# Patient Record
Sex: Female | Born: 1955 | Race: Black or African American | Hispanic: No | Marital: Single | Smoking: Never smoker
Health system: Southern US, Community
[De-identification: ages and names within clinical notes are randomized; demographics above are authoritative.]

## PROBLEM LIST (undated history)

## (undated) DIAGNOSIS — E785 Hyperlipidemia, unspecified: Secondary | ICD-10-CM

## (undated) DIAGNOSIS — D649 Anemia, unspecified: Secondary | ICD-10-CM

## (undated) DIAGNOSIS — F32A Depression, unspecified: Secondary | ICD-10-CM

## (undated) DIAGNOSIS — M1990 Unspecified osteoarthritis, unspecified site: Secondary | ICD-10-CM

## (undated) DIAGNOSIS — R7303 Prediabetes: Secondary | ICD-10-CM

## (undated) DIAGNOSIS — F419 Anxiety disorder, unspecified: Secondary | ICD-10-CM

## (undated) DIAGNOSIS — R7302 Impaired glucose tolerance (oral): Secondary | ICD-10-CM

## (undated) DIAGNOSIS — I10 Essential (primary) hypertension: Secondary | ICD-10-CM

## (undated) DIAGNOSIS — K219 Gastro-esophageal reflux disease without esophagitis: Secondary | ICD-10-CM

## (undated) DIAGNOSIS — Z87448 Personal history of other diseases of urinary system: Secondary | ICD-10-CM

## (undated) DIAGNOSIS — R42 Dizziness and giddiness: Secondary | ICD-10-CM

## (undated) DIAGNOSIS — Z9889 Other specified postprocedural states: Secondary | ICD-10-CM

## (undated) DIAGNOSIS — G5131 Clonic hemifacial spasm, right: Secondary | ICD-10-CM

## (undated) DIAGNOSIS — Z86010 Personal history of colonic polyps: Secondary | ICD-10-CM

## (undated) HISTORY — DX: Dizziness and giddiness: R42

## (undated) HISTORY — DX: Personal history of other diseases of urinary system: Z87.448

## (undated) HISTORY — DX: Hyperlipidemia, unspecified: E78.5

## (undated) HISTORY — DX: Unspecified osteoarthritis, unspecified site: M19.90

## (undated) HISTORY — DX: Essential (primary) hypertension: I10

## (undated) HISTORY — PX: NO PAST SURGERIES: SHX2092

## (undated) HISTORY — DX: Other specified postprocedural states: Z98.890

## (undated) HISTORY — PX: COLONOSCOPY: SHX5424

## (undated) HISTORY — DX: Gastro-esophageal reflux disease without esophagitis: K21.9

## (undated) HISTORY — DX: Personal history of colonic polyps: Z86.010

## (undated) HISTORY — DX: Impaired glucose tolerance (oral): R73.02

---

## 1999-01-12 ENCOUNTER — Other Ambulatory Visit: Admission: RE | Admit: 1999-01-12 | Discharge: 1999-01-12 | Payer: Self-pay | Admitting: *Deleted

## 2000-01-01 ENCOUNTER — Other Ambulatory Visit: Admission: RE | Admit: 2000-01-01 | Discharge: 2000-01-01 | Payer: Self-pay | Admitting: *Deleted

## 2001-02-03 ENCOUNTER — Other Ambulatory Visit: Admission: RE | Admit: 2001-02-03 | Discharge: 2001-02-03 | Payer: Self-pay | Admitting: *Deleted

## 2002-06-07 ENCOUNTER — Ambulatory Visit (HOSPITAL_COMMUNITY): Admission: RE | Admit: 2002-06-07 | Discharge: 2002-06-07 | Payer: Self-pay | Admitting: Internal Medicine

## 2003-06-09 ENCOUNTER — Ambulatory Visit (HOSPITAL_COMMUNITY): Admission: RE | Admit: 2003-06-09 | Discharge: 2003-06-09 | Payer: Self-pay | Admitting: Internal Medicine

## 2003-08-09 ENCOUNTER — Other Ambulatory Visit: Admission: RE | Admit: 2003-08-09 | Discharge: 2003-08-09 | Payer: Self-pay | Admitting: Obstetrics and Gynecology

## 2004-06-04 ENCOUNTER — Ambulatory Visit: Payer: Self-pay | Admitting: Internal Medicine

## 2004-06-11 ENCOUNTER — Ambulatory Visit (HOSPITAL_COMMUNITY): Admission: RE | Admit: 2004-06-11 | Discharge: 2004-06-11 | Payer: Self-pay | Admitting: Family Medicine

## 2004-07-24 ENCOUNTER — Ambulatory Visit: Payer: Self-pay | Admitting: Internal Medicine

## 2004-09-04 ENCOUNTER — Ambulatory Visit: Payer: Self-pay | Admitting: Internal Medicine

## 2004-09-17 ENCOUNTER — Ambulatory Visit: Payer: Self-pay | Admitting: Internal Medicine

## 2004-09-18 ENCOUNTER — Ambulatory Visit: Payer: Self-pay | Admitting: Internal Medicine

## 2004-10-23 ENCOUNTER — Ambulatory Visit: Payer: Self-pay | Admitting: Internal Medicine

## 2004-10-24 ENCOUNTER — Ambulatory Visit: Payer: Self-pay | Admitting: Internal Medicine

## 2004-12-10 ENCOUNTER — Ambulatory Visit: Payer: Self-pay | Admitting: Internal Medicine

## 2004-12-11 ENCOUNTER — Other Ambulatory Visit: Admission: RE | Admit: 2004-12-11 | Discharge: 2004-12-11 | Payer: Self-pay | Admitting: Obstetrics and Gynecology

## 2004-12-18 ENCOUNTER — Ambulatory Visit: Payer: Self-pay | Admitting: Internal Medicine

## 2005-01-02 ENCOUNTER — Ambulatory Visit: Payer: Self-pay | Admitting: Internal Medicine

## 2005-01-04 ENCOUNTER — Ambulatory Visit: Payer: Self-pay | Admitting: *Deleted

## 2005-03-04 ENCOUNTER — Ambulatory Visit: Payer: Self-pay | Admitting: Internal Medicine

## 2005-09-26 ENCOUNTER — Encounter: Admission: RE | Admit: 2005-09-26 | Discharge: 2005-09-26 | Payer: Self-pay | Admitting: Obstetrics and Gynecology

## 2005-10-30 ENCOUNTER — Ambulatory Visit: Payer: Self-pay | Admitting: Internal Medicine

## 2006-06-05 ENCOUNTER — Ambulatory Visit: Payer: Self-pay | Admitting: Internal Medicine

## 2006-08-13 ENCOUNTER — Ambulatory Visit: Payer: Self-pay | Admitting: *Deleted

## 2006-09-29 ENCOUNTER — Ambulatory Visit (HOSPITAL_COMMUNITY): Admission: RE | Admit: 2006-09-29 | Discharge: 2006-09-29 | Payer: Self-pay | Admitting: Family Medicine

## 2006-10-30 ENCOUNTER — Ambulatory Visit: Payer: Self-pay | Admitting: Internal Medicine

## 2006-11-06 ENCOUNTER — Ambulatory Visit: Payer: Self-pay | Admitting: Internal Medicine

## 2007-01-22 DIAGNOSIS — I10 Essential (primary) hypertension: Secondary | ICD-10-CM

## 2007-01-22 HISTORY — DX: Essential (primary) hypertension: I10

## 2007-04-15 ENCOUNTER — Encounter (INDEPENDENT_AMBULATORY_CARE_PROVIDER_SITE_OTHER): Payer: Self-pay | Admitting: *Deleted

## 2007-04-22 ENCOUNTER — Ambulatory Visit: Payer: Self-pay | Admitting: Internal Medicine

## 2007-04-24 ENCOUNTER — Encounter (INDEPENDENT_AMBULATORY_CARE_PROVIDER_SITE_OTHER): Payer: Self-pay | Admitting: Internal Medicine

## 2007-04-24 LAB — CONVERTED CEMR LAB
Cholesterol: 204 mg/dL — ABNORMAL HIGH (ref 0–200)
HDL: 50 mg/dL (ref >39)
Total CHOL/HDL Ratio: 4.1
VLDL: 21 mg/dL (ref 0–40)

## 2007-06-10 ENCOUNTER — Ambulatory Visit: Payer: Self-pay | Admitting: Internal Medicine

## 2007-06-11 ENCOUNTER — Ambulatory Visit: Payer: Self-pay | Admitting: Internal Medicine

## 2007-06-11 LAB — CONVERTED CEMR LAB
LDL Cholesterol: 92 mg/dL (ref 0–99)
VLDL: 19 mg/dL (ref 0–40)

## 2007-10-28 ENCOUNTER — Ambulatory Visit (HOSPITAL_COMMUNITY): Admission: RE | Admit: 2007-10-28 | Discharge: 2007-10-28 | Payer: Self-pay | Admitting: Occupational Therapy

## 2007-12-14 ENCOUNTER — Ambulatory Visit: Payer: Self-pay | Admitting: Internal Medicine

## 2008-08-29 DIAGNOSIS — Z9889 Other specified postprocedural states: Secondary | ICD-10-CM

## 2008-08-29 HISTORY — DX: Other specified postprocedural states: Z98.890

## 2008-11-22 ENCOUNTER — Ambulatory Visit (HOSPITAL_COMMUNITY): Admission: RE | Admit: 2008-11-22 | Discharge: 2008-11-22 | Payer: Self-pay | Admitting: Obstetrics and Gynecology

## 2009-09-15 ENCOUNTER — Ambulatory Visit: Payer: Self-pay | Admitting: Internal Medicine

## 2009-09-15 LAB — CONVERTED CEMR LAB
Albumin: 4.2 g/dL (ref 3.5–5.2)
Alkaline Phosphatase: 78 units/L (ref 39–117)
Basophils Absolute: 0 10*3/uL (ref 0.0–0.1)
Chloride: 103 meq/L (ref 96–112)
Cholesterol: 153 mg/dL (ref 0–200)
Eosinophils Absolute: 0.1 10*3/uL (ref 0.0–0.7)
HDL: 56.3 mg/dL (ref >39.00)
Hemoglobin: 13.5 g/dL (ref 12.0–15.0)
LDL Cholesterol: 89 mg/dL (ref 0–99)
Leukocytes, UA: NEGATIVE
Lymphocytes Relative: 30.4 % (ref 12.0–46.0)
MCHC: 33.2 g/dL (ref 30.0–36.0)
Neutro Abs: 3.1 10*3/uL (ref 1.4–7.7)
Platelets: 278 10*3/uL (ref 150.0–400.0)
Potassium: 3.5 meq/L (ref 3.5–5.1)
RDW: 12.8 % (ref 11.5–14.6)
Sodium: 142 meq/L (ref 135–145)
Specific Gravity, Urine: 1.025 (ref 1.000–1.030)
TSH: 0.41 microintl units/mL (ref 0.35–5.50)
Triglycerides: 39 mg/dL (ref 0.0–149.0)
Urine Glucose: NEGATIVE mg/dL
Urobilinogen, UA: 0.2 (ref 0.0–1.0)
VLDL: 7.8 mg/dL (ref 0.0–40.0)

## 2009-09-22 ENCOUNTER — Ambulatory Visit: Payer: Self-pay | Admitting: Internal Medicine

## 2009-09-22 DIAGNOSIS — R42 Dizziness and giddiness: Secondary | ICD-10-CM

## 2009-09-22 DIAGNOSIS — Z87448 Personal history of other diseases of urinary system: Secondary | ICD-10-CM

## 2009-09-22 DIAGNOSIS — E739 Lactose intolerance, unspecified: Secondary | ICD-10-CM

## 2009-09-22 DIAGNOSIS — Z860100 Personal history of colon polyps, unspecified: Secondary | ICD-10-CM | POA: Insufficient documentation

## 2009-09-22 DIAGNOSIS — E785 Hyperlipidemia, unspecified: Secondary | ICD-10-CM

## 2009-09-22 DIAGNOSIS — K219 Gastro-esophageal reflux disease without esophagitis: Secondary | ICD-10-CM | POA: Insufficient documentation

## 2009-09-22 HISTORY — DX: Hyperlipidemia, unspecified: E78.5

## 2009-09-22 HISTORY — DX: Personal history of colonic polyps: Z86.010

## 2009-09-22 HISTORY — DX: Personal history of other diseases of urinary system: Z87.448

## 2009-09-22 HISTORY — DX: Gastro-esophageal reflux disease without esophagitis: K21.9

## 2009-09-22 HISTORY — DX: Personal history of colon polyps, unspecified: Z86.0100

## 2009-10-11 ENCOUNTER — Encounter: Payer: Self-pay | Admitting: Internal Medicine

## 2009-10-11 ENCOUNTER — Ambulatory Visit: Payer: Self-pay | Admitting: Cardiology

## 2009-10-11 ENCOUNTER — Ambulatory Visit (HOSPITAL_COMMUNITY): Admission: RE | Admit: 2009-10-11 | Discharge: 2009-10-11 | Payer: Self-pay | Admitting: Internal Medicine

## 2009-10-11 ENCOUNTER — Ambulatory Visit: Payer: Self-pay

## 2009-10-26 ENCOUNTER — Telehealth: Payer: Self-pay | Admitting: Internal Medicine

## 2009-12-27 ENCOUNTER — Ambulatory Visit (HOSPITAL_COMMUNITY): Admission: RE | Admit: 2009-12-27 | Discharge: 2009-12-27 | Payer: Self-pay | Admitting: Obstetrics and Gynecology

## 2010-08-30 NOTE — Progress Notes (Signed)
Summary: increased BS  Phone Note Call from Patient Call back at [phone]   Summary of Call: Patient left message on triage c/o increased BS levels. Please call her at [phone]. Initial call taken by: Lucious Groves,  October 26, 2009 2:23 PM  Follow-up for Phone Call        pt called stating that her glucose last lab was "high" and she wanted to know why MD did not RX any meds for this. I advised pt this was slightly out of range but not so much to need medications. pt understood. Follow-up by: Margaret Pyle, CMA,  October 26, 2009 2:44 PM

## 2010-08-30 NOTE — Miscellaneous (Signed)
Summary: Orders Update  Clinical Lists Changes  Orders: Added new Test order of Carotid Duplex (Carotid Duplex) - Signed 

## 2010-08-30 NOTE — Assessment & Plan Note (Signed)
Summary: CPX-LIGHT-HEADED,DIZZINESS,DRY MOUTH-LB   Vital Signs:  Patient profile:   55 year old female Height:      64 inches Weight:      168.25 pounds BMI:     28.98 O2 Sat:      96 % on Room air Temp:     97.6 degrees F oral Pulse rate:   114 / minute BP sitting:   100 / 60  (left arm) Cuff size:   regular  Vitals Entered ByZella Ball Ewing (September 22, 2009 8:53 AM)  O2 Flow:  Room air  Preventive Care Screening  Colonoscopy:    Date:  08/29/2008    Next Due:  08/2013    Results:  abnormal   Mammogram:    Date:  07/29/2009    Results:  normal   Pap Smear:    Date:  07/29/2009    Results:  normal   Last Tetanus Booster:    Date:  07/30/2003    Results:  Tdap      had flu shot in fall 2010  CC: Adult physical/RE   CC:  Adult physical/RE.  History of Present Illness: more stress lately with work and mother illness but denies depressive symtpoms or suicidal ideation or panic;  no overt bleeding or bruising;  no GU symtpoms such as blood, dysuria, freq or urgency.  trying to follow low chol diet;  overall good med tolerance and compliacne;  has some nonspecific dizziness and dry mouth but no polydipsia or polyuria or ENT symptoms such as fever, congestion or ear fullness or vertigo  Preventive Screening-Counseling & Management  Alcohol-Tobacco     Smoking Status: quit      Drug Use:  no.    Problems Prior to Update: 1)  Glucose Intolerance  (ICD-271.3) 2)  Gerd  (ICD-530.81) 3)  Preventive Health Care  (ICD-V70.0) 4)  Colonic Polyps, Hx of  (ICD-V12.72) 5)  Dizziness  (ICD-780.4) 6)  Hyperlipidemia  (ICD-272.4) 7)  Hematuria, Microscopic, Hx of  (ICD-V13.09) 8)  Hypertension  (ICD-401.9)  Medications Prior to Update: 1)  Lisinopril-Hydrochlorothiazide 10-12.5 Mg  Tabs (Lisinopril-Hydrochlorothiazide) .... One By Mouth Qd 2)  K-Lor 20 Meq  Pack (Potassium Chloride) .... One By Mouth Once Daily  Current Medications (verified): 1)  Benazepril Hcl 40 Mg  Tabs (Benazepril Hcl) .Marland Kitchen.. 1po Once Daily 2)  Pravachol 20 Mg Tabs (Pravastatin Sodium) .Marland Kitchen.. 1 By Mouth Once Daily  Allergies (verified): 1)  ! 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 mother with DM, heart disease, , HTN, elev chol sister with DM, thyroid disease p-grandmother - cancer  Social History: Reviewed history and no changes required. 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         all otherwise negative per pt -  no OSA symptoms  Physical Exam  General:  alert and overweight-appearing.   Head:  normocephalic and atraumatic.   Eyes:  vision grossly intact, pupils equal, and pupils round.   Ears:  R ear normal and L ear normal.   Nose:  no external deformity and no  nasal discharge.   Mouth:  no gingival abnormalities and pharynx pink and moist.   Neck:  supple and no masses.   Lungs:  normal respiratory effort and normal breath sounds.   Heart:  normal rate and regular rhythm.   Abdomen:  soft, non-tender, and normal bowel sounds.   Msk:  no joint tenderness and no joint swelling.   Extremities:  no edema, no erythema  Neurologic:  cranial nerves II-XII intact, strength normal in all extremities, sensation intact to light touch, and DTRs symmetrical and normal.   Psych:  moderately anxious.     Impression & Recommendations:  Problem # 1:  Preventive Health Care (ICD-V70.0) Overall doing well, age appropriate education and counseling updated and referral for appropriate preventive services done unless declined, immunizations up to date or declined, diet counseling done if overweight, urged to quit smoking if smokes , most recent labs reviewed and current ordered if appropriate, ecg reviewed or declined (interpretation per ECG scanned in the EMR if done); information regarding Medicare Prevention requirements given if appropriate  Orders: EKG w/ Interpretation (93000)  Problem # 2:  HEMATURIA, MICROSCOPIC, HX OF (ICD-V13.09) refer urology   Problem # 3:  DIZZINESS (ICD-780.4)  unclear etiology - to check echo, and dopplers given age and risk factors, ? related to anxiety, exam o/w benign, consider MRI or neuro exam  Orders: Echo Referral (Echo) Misc. Referral (Misc. Ref)  Problem # 4:  HYPERLIPIDEMIA (ICD-272.4)  Her updated medication list for this problem includes:    Pravachol 20 Mg Tabs (Pravastatin sodium) .Marland Kitchen... 1 by mouth once daily  Labs Reviewed: SGOT: 20 (09/15/2009)   SGPT: 24 (09/15/2009)   HDL:56.30 (09/15/2009), 49 (06/11/2007)  LDL:89 (09/15/2009), 92 (06/11/2007)  Chol:153 (09/15/2009), 160 (06/11/2007)  Trig:39.0 (09/15/2009), 93 (06/11/2007) stable overall by hx and exam, ok to continue meds/tx as is   Complete Medication  List: 1)  Benazepril Hcl 40 Mg Tabs (Benazepril hcl) .Marland Kitchen.. 1po once daily 2)  Pravachol 20 Mg Tabs (Pravastatin sodium) .Marland Kitchen.. 1 by mouth once daily  Patient Instructions: 1)  You will be contacted about the referral(s) to: echocardiogram, and the carotid artery test 2)  stop the lisinopril HCT 3)  stop the Potassium pill 4)  start the benazepril 40 mg per day 5)  continue the pravastatin as is 6)  You will be contacted about the referral(s) to: Echocardiogram, and carotid artery test 7)  Please schedule a follow-up appointment in 6 months with: 8)  BMP prior to visit, ICD-9: 790.2 9)  Lipid Panel prior to visit, ICD-9: 10)  HbgA1C prior to visit, ICD-9: 11)  Check your Blood Pressure regularly. If it is above 140/90: you should make an appointment. If it is LOW (regularly less than 110 on the top number and you still have the dizziness, you can take HALF of the benazepril) 12)  Take an Aspirin every day - 81 mg -  1 per day - COATED pill only 13)  Plesase call or return for any persistent or worsening dizziness Prescriptions: PRAVACHOL 20 MG TABS (PRAVASTATIN SODIUM) 1 by mouth once daily  #90 x 3   Entered and Authorized by:   Corwin Levins MD   Signed by:   Corwin Levins MD on 09/22/2009   Method used:   Print then Give to Patient   RxID:   [id number] BENAZEPRIL HCL 40 MG TABS (BENAZEPRIL HCL) 1po once daily  #90 x 3   Entered and Authorized by:   Corwin Levins MD   Signed by:   Corwin Levins MD on 09/22/2009   Method used:   Print then Give to Patient   RxID:   [id number]    Immunization History:  Influenza Immunization History:    Influenza:  historical (04/28/2009)

## 2010-10-27 ENCOUNTER — Other Ambulatory Visit: Payer: Self-pay | Admitting: Internal Medicine

## 2010-11-22 ENCOUNTER — Other Ambulatory Visit: Payer: Self-pay | Admitting: Internal Medicine

## 2010-11-30 ENCOUNTER — Other Ambulatory Visit (HOSPITAL_COMMUNITY): Payer: Self-pay | Admitting: Obstetrics and Gynecology

## 2010-11-30 DIAGNOSIS — Z1231 Encounter for screening mammogram for malignant neoplasm of breast: Secondary | ICD-10-CM

## 2010-12-27 ENCOUNTER — Other Ambulatory Visit: Payer: Self-pay | Admitting: Internal Medicine

## 2011-01-06 ENCOUNTER — Encounter: Payer: Self-pay | Admitting: Internal Medicine

## 2011-01-06 DIAGNOSIS — R7303 Prediabetes: Secondary | ICD-10-CM | POA: Insufficient documentation

## 2011-01-06 DIAGNOSIS — R7302 Impaired glucose tolerance (oral): Secondary | ICD-10-CM

## 2011-01-06 HISTORY — DX: Impaired glucose tolerance (oral): R73.02

## 2011-01-07 ENCOUNTER — Encounter: Payer: Self-pay | Admitting: Internal Medicine

## 2011-01-08 ENCOUNTER — Encounter: Payer: Self-pay | Admitting: Internal Medicine

## 2011-01-08 ENCOUNTER — Ambulatory Visit (HOSPITAL_COMMUNITY)
Admission: RE | Admit: 2011-01-08 | Discharge: 2011-01-08 | Disposition: A | Discharge status: Home / Self Care | Payer: BC Managed Care – PPO | Source: Ambulatory Visit | Attending: Obstetrics and Gynecology | Admitting: Obstetrics and Gynecology

## 2011-01-08 ENCOUNTER — Other Ambulatory Visit (INDEPENDENT_AMBULATORY_CARE_PROVIDER_SITE_OTHER): Payer: BC Managed Care – PPO

## 2011-01-08 ENCOUNTER — Ambulatory Visit (INDEPENDENT_AMBULATORY_CARE_PROVIDER_SITE_OTHER): Payer: BC Managed Care – PPO | Admitting: Internal Medicine

## 2011-01-08 VITALS — BP 120/82 | HR 83 | Temp 99.0°F | Ht 64.0 in | Wt 169.1 lb

## 2011-01-08 DIAGNOSIS — Z1231 Encounter for screening mammogram for malignant neoplasm of breast: Secondary | ICD-10-CM | POA: Insufficient documentation

## 2011-01-08 DIAGNOSIS — F341 Dysthymic disorder: Secondary | ICD-10-CM

## 2011-01-08 DIAGNOSIS — Z0000 Encounter for general adult medical examination without abnormal findings: Secondary | ICD-10-CM

## 2011-01-08 DIAGNOSIS — F419 Anxiety disorder, unspecified: Secondary | ICD-10-CM | POA: Insufficient documentation

## 2011-01-08 DIAGNOSIS — R7302 Impaired glucose tolerance (oral): Secondary | ICD-10-CM

## 2011-01-08 DIAGNOSIS — F329 Major depressive disorder, single episode, unspecified: Secondary | ICD-10-CM

## 2011-01-08 DIAGNOSIS — R7309 Other abnormal glucose: Secondary | ICD-10-CM

## 2011-01-08 LAB — URINALYSIS, ROUTINE W REFLEX MICROSCOPIC
Nitrite: NEGATIVE
Total Protein, Urine: NEGATIVE
pH: 6.5 (ref 5.0–8.0)

## 2011-01-08 LAB — LIPID PANEL
Cholesterol: 149 mg/dL (ref 0–200)
HDL: 49.5 mg/dL (ref >39.00)
LDL Cholesterol: 85 mg/dL (ref 0–99)
Triglycerides: 73 mg/dL (ref 0.0–149.0)
VLDL: 14.6 mg/dL (ref 0.0–40.0)

## 2011-01-08 LAB — CBC WITH DIFFERENTIAL/PLATELET
Basophils Relative: 0.3 % (ref 0.0–3.0)
Eosinophils Absolute: 0.2 10*3/uL (ref 0.0–0.7)
MCHC: 34.2 g/dL (ref 30.0–36.0)
MCV: 92 fl (ref 78.0–100.0)
Monocytes Absolute: 0.5 10*3/uL (ref 0.1–1.0)
Neutrophils Relative %: 56.6 % (ref 43.0–77.0)
Platelets: 269 10*3/uL (ref 150.0–400.0)

## 2011-01-08 LAB — BASIC METABOLIC PANEL
BUN: 16 mg/dL (ref 6–23)
Chloride: 102 mEq/L (ref 96–112)
Potassium: 3.7 mEq/L (ref 3.5–5.1)
Sodium: 138 mEq/L (ref 135–145)

## 2011-01-08 LAB — HEPATIC FUNCTION PANEL
Bilirubin, Direct: 0.1 mg/dL (ref 0.0–0.3)
Total Bilirubin: 0.5 mg/dL (ref 0.3–1.2)
Total Protein: 8.1 g/dL (ref 6.0–8.3)

## 2011-01-08 MED ORDER — ESCITALOPRAM OXALATE 10 MG PO TABS: 10.0000 mg | ORAL_TABLET | Freq: Every day | ORAL | Status: AC

## 2011-01-08 MED ORDER — PRAVASTATIN SODIUM 20 MG PO TABS: 20.0000 mg | ORAL_TABLET | Freq: Every day | ORAL | Status: DC

## 2011-01-08 MED ORDER — ESCITALOPRAM OXALATE 10 MG PO TABS: 10.0000 mg | ORAL_TABLET | Freq: Every day | ORAL | Status: DC

## 2011-01-08 MED ORDER — BENAZEPRIL HCL 40 MG PO TABS: 40.0000 mg | ORAL_TABLET | Freq: Every day | ORAL | Status: DC

## 2011-01-08 NOTE — Assessment & Plan Note (Signed)
asymtp - to check a1c

## 2011-01-08 NOTE — Progress Notes (Signed)
Subjective:    Patient ID: Julie Greene, female    DOB: May 02, 1956, 55 y.o.   MRN: [id number]  HPI  Here for wellness and f/u;  Overall doing ok;  Pt denies CP, worsening SOB, DOE, wheezing, orthopnea, PND, worsening LE edema, palpitations, dizziness or syncope.  Pt denies neurological change such as new Headache, facial or extremity weakness.  Pt denies polydipsia, polyuria, or low sugar symptoms. Pt states overall good compliance with treatment and medications, good tolerability, and trying to follow lower cholesterol diet.  Pt has had mild worsening depressive symptoms and anxiety due to mult stressors, but no suicidal ideation or panic. No fever, wt loss, night sweats, loss of appetite, or other constitutional symptoms.  Pt states good ability with ADL's, low fall risk, home safety reviewed and adequate, no significant changes in hearing or vision, and occasionally active with exercise. Past Medical History  Diagnosis Date  . Impaired glucose tolerance 01/06/2011  . COLONIC POLYPS, HX OF 09/22/2009  . HEMATURIA, MICROSCOPIC, HX OF 09/22/2009  . GERD 09/22/2009  . HYPERLIPIDEMIA 09/22/2009  . HYPERTENSION 01/22/2007  . Fibroid, uterine   . Anxiety and depression 01/08/2011   Past Surgical History  Procedure Date  . No past surgeries     reports that she has been passively smoking.  She does not have any smokeless tobacco history on file. She reports that she drinks alcohol. She reports that she does not use illicit drugs. family history includes Diabetes in her mother; Hyperlipidemia in her mother; and Hypertension in her mother. Allergies  Allergen Reactions  . Sulfonamide Derivatives     REACTION: unknown rxn   Current Outpatient Prescriptions on File Prior to Visit  Medication Sig Dispense Refill  . DISCONTD: benazepril (LOTENSIN) 40 MG tablet Take 40 mg by mouth daily.        Marland Kitchen DISCONTD: pravastatin (PRAVACHOL) 20 MG tablet TAKE ONE TABLET BY MOUTH EVERY DAY  30 tablet  0     Review of Systems Review of Systems  Constitutional: Negative for diaphoresis, activity change, appetite change and unexpected weight change.  HENT: Negative for hearing loss, ear pain, facial swelling, mouth sores and neck stiffness.   Eyes: Negative for pain, redness and visual disturbance.  Respiratory: Negative for shortness of breath and wheezing.   Cardiovascular: Negative for chest pain and palpitations.  Gastrointestinal: Negative for diarrhea, blood in stool, abdominal distention and rectal pain.  Genitourinary: Negative for hematuria, flank pain and decreased urine volume.  Musculoskeletal: Negative for myalgias and joint swelling.  Skin: Negative for color change and wound.  Neurological: Negative for syncope and numbness.  Hematological: Negative for adenopathy.  Psychiatric/Behavioral: Negative for hallucinations, self-injury, decreased concentration and agitation.      Objective:   Physical Exam BP 120/82  Pulse 83  Temp(Src) 99 F (37.2 C) (Oral)  Ht 5\' 4"  (1.626 m)  Wt 169 lb 2 oz (76.715 kg)  BMI 29.03 kg/m2  SpO2 99% Physical Exam  VS noted Constitutional: Pt is oriented to person, place, and time. Appears well-developed and well-nourished.  HENT:  Head: Normocephalic and atraumatic.  Right Ear: External ear normal.  Left Ear: External ear normal.  Nose: Nose normal.  Mouth/Throat: Oropharynx is clear and moist.  Eyes: Conjunctivae and EOM are normal. Pupils are equal, round, and reactive to light.  Neck: Normal range of motion. Neck supple. No JVD present. No tracheal deviation present.  Cardiovascular: Normal rate, regular rhythm, normal heart sounds and intact distal pulses.   Pulmonary/Chest:  Effort normal and breath sounds normal.  Abdominal: Soft. Bowel sounds are normal. There is no tenderness.  Musculoskeletal: Normal range of motion. Exhibits no edema.  Lymphadenopathy:  Has no cervical adenopathy.  Neurological: Pt is alert and oriented to  person, place, and time. Pt has normal reflexes. No cranial nerve deficit.  Skin: Skin is warm and dry. No rash noted.  Psychiatric:  Has  normal mood and affect. Behavior is normal.  Has depressed affect       Assessment & Plan:

## 2011-01-08 NOTE — Assessment & Plan Note (Signed)

## 2011-01-08 NOTE — Assessment & Plan Note (Signed)
Mild to mod, for lexapro asd, declines counseling at this time

## 2011-01-08 NOTE — Progress Notes (Signed)
Quick Note:  Voice message left on [employer] system - lab is negative, normal or otherwise stable, pt to continue same tx ______ 

## 2011-01-08 NOTE — Patient Instructions (Signed)
Take all new medications as prescribed Continue all other medications as before Please go to LAB in the Basement for the blood and/or urine tests to be done today Please call the phone number [phone] (the [employer] System) for results of testing in 2-3 days;  When calling, simply dial the number, and when prompted enter the MRN number above (the Medical Record Number) and the # key, then the message should start. Happy Birthday! Please call if you would like to be referred for counseling Please return in 1 year for your yearly visit, or sooner if needed, with Lab testing done 3-5 days before

## 2011-02-06 ENCOUNTER — Other Ambulatory Visit: Payer: Self-pay | Admitting: Internal Medicine

## 2011-03-18 ENCOUNTER — Other Ambulatory Visit: Payer: Self-pay | Admitting: Internal Medicine

## 2011-03-19 ENCOUNTER — Other Ambulatory Visit: Payer: Self-pay

## 2011-03-19 MED ORDER — FENOFIBRATE 160 MG PO TABS: 160.0000 mg | ORAL_TABLET | Freq: Every day | ORAL | Status: DC

## 2011-04-16 ENCOUNTER — Other Ambulatory Visit: Payer: Self-pay | Admitting: Internal Medicine

## 2011-08-21 ENCOUNTER — Telehealth: Payer: Self-pay

## 2011-08-21 NOTE — Telephone Encounter (Signed)
Patient's BP has been elevated. Saturday 152/102 and checked today was 164/114. Patient has had some numbness in left arm, thinks may be from tendonitis?? Call back number [phone]

## 2011-08-22 MED ORDER — AMLODIPINE BESYLATE 2.5 MG PO TABS: 2.5000 mg | ORAL_TABLET | Freq: Every day | ORAL | Status: DC

## 2011-08-22 NOTE — Telephone Encounter (Signed)
Start generic norvasc 2.5 mg daily in addition to other meds - erx done - pt needs OV next week with PCP to review BP and meds, titrate as needed - take OTC aleve 1 tab bid for 5 days for tendonitis, call for OV if worse or unimproved symptoms - thanks

## 2011-10-29 ENCOUNTER — Telehealth: Payer: Self-pay | Admitting: Internal Medicine

## 2011-10-29 NOTE — Telephone Encounter (Signed)
The pt called and asked how her upcoming ov would be billed to her insurance.  The phone line was disconnected on our end on accident.  The patient was asked to hold, but the red 'hang up' button was pressed on accident.  The patient called back promptly and while looking up her appt information, she hung up the line.

## 2011-10-31 ENCOUNTER — Encounter: Payer: Self-pay | Admitting: Internal Medicine

## 2011-10-31 ENCOUNTER — Ambulatory Visit (INDEPENDENT_AMBULATORY_CARE_PROVIDER_SITE_OTHER): Payer: PRIVATE HEALTH INSURANCE | Admitting: Internal Medicine

## 2011-10-31 VITALS — BP 130/80 | HR 82 | Temp 97.3°F | Ht 64.0 in | Wt 159.0 lb

## 2011-10-31 DIAGNOSIS — E785 Hyperlipidemia, unspecified: Secondary | ICD-10-CM

## 2011-10-31 DIAGNOSIS — I10 Essential (primary) hypertension: Secondary | ICD-10-CM

## 2011-10-31 DIAGNOSIS — F341 Dysthymic disorder: Secondary | ICD-10-CM

## 2011-10-31 DIAGNOSIS — F329 Major depressive disorder, single episode, unspecified: Secondary | ICD-10-CM

## 2011-10-31 DIAGNOSIS — R7309 Other abnormal glucose: Secondary | ICD-10-CM

## 2011-10-31 DIAGNOSIS — R7302 Impaired glucose tolerance (oral): Secondary | ICD-10-CM

## 2011-10-31 MED ORDER — PRAVASTATIN SODIUM 20 MG PO TABS: 20.0000 mg | ORAL_TABLET | Freq: Every day | ORAL | Status: DC

## 2011-10-31 MED ORDER — FENOFIBRATE 160 MG PO TABS: 160.0000 mg | ORAL_TABLET | Freq: Every day | ORAL | Status: DC

## 2011-10-31 MED ORDER — ASPIRIN 81 MG PO TBEC: 81.0000 mg | DELAYED_RELEASE_TABLET | Freq: Every day | ORAL | Status: AC

## 2011-10-31 MED ORDER — BENAZEPRIL HCL 40 MG PO TABS: 40.0000 mg | ORAL_TABLET | Freq: Every day | ORAL | Status: DC

## 2011-10-31 NOTE — Patient Instructions (Addendum)
Please take Aspirin 81 mg - 1 per day - COATED only Continue all other medications as before, except ok to stay off the amlodipine as you have been doing All of your prescriptions were refilled today Please keep your appointments with your specialists as you have planned - GYN Please return in 1 year for your yearly visit, or sooner if needed

## 2011-11-03 ENCOUNTER — Encounter: Payer: Self-pay | Admitting: Internal Medicine

## 2011-11-03 NOTE — Assessment & Plan Note (Signed)
stable overall by hx and exam, most recent data reviewed with pt, and pt to continue medical treatment as before  Lab Results  Component Value Date   LDLCALC 85 01/08/2011

## 2011-11-03 NOTE — Assessment & Plan Note (Signed)
stable overall by hx and exam, most recent data reviewed with pt, and pt to continue medical treatment as before  Lab Results  Component Value Date   HGBA1C 6.2 01/08/2011

## 2011-11-03 NOTE — Progress Notes (Signed)
Subjective:    Patient ID: Julie Greene, female    DOB: [birth year], 56 y.o.   MRN: [id number]  HPI  Here to f/u;  BP elev jan 2013, took amlod 2.5 mg for one month then stopped since it seemed better, and overall better since then per pt with mult BP checks since then with focus on lower stress, less pain, less salt and working on wt loss.  Pt denies chest pain, increased sob or doe, wheezing, orthopnea, PND, increased LE swelling, palpitations, dizziness or syncope.  Pt denies new neurological symptoms such as new headache, or facial or extremity weakness or numbness   Pt denies polydipsia, polyuria Pt states overall good compliance with meds, trying to follow lower cholesterol diet.   Pt denies fever, wt loss, night sweats, loss of appetite, or other constitutional symptoms  Denies worsening depressive symptoms, suicidal ideation, or panic, though has ongoing anxiety. Past Medical History  Diagnosis Date  . Impaired glucose tolerance 01/06/2011  . COLONIC POLYPS, HX OF 09/22/2009  . HEMATURIA, MICROSCOPIC, HX OF 09/22/2009  . GERD 09/22/2009  . HYPERLIPIDEMIA 09/22/2009  . HYPERTENSION 01/22/2007  . Fibroid, uterine   . Anxiety and depression 01/08/2011   Past Surgical History  Procedure Date  . No past surgeries     reports that she has been passively smoking.  She does not have any smokeless tobacco history on file. She reports that she drinks alcohol. She reports that she does not use illicit drugs. family history includes Diabetes in her mother; Hyperlipidemia in her mother; and Hypertension in her mother. Allergies  Allergen Reactions  . Sulfonamide Derivatives     REACTION: unknown rxn   Current Outpatient Prescriptions on File Prior to Visit  Medication Sig Dispense Refill  . benazepril (LOTENSIN) 40 MG tablet Take 1 tablet (40 mg total) by mouth daily.  90 tablet  3  . pravastatin (PRAVACHOL) 20 MG tablet Take 1 tablet (20 mg total) by mouth daily.  90 tablet  3  . fenofibrate  160 MG tablet Take 1 tablet (160 mg total) by mouth daily.  90 tablet  0   Review of Systems Review of Systems  Constitutional: Negative for diaphoresis and unexpected weight change.  HENT: Negative for drooling and tinnitus.   Eyes: Negative for photophobia and visual disturbance.  Respiratory: Negative for choking and stridor.   Gastrointestinal: Negative for vomiting and blood in stool.  Genitourinary: Negative for hematuria and decreased urine volume.  Musculoskeletal: Negative for gait problem.  Skin: Negative for color change and wound.  Neurological: Negative for tremors and numbness.  Psychiatric/Behavioral: Negative for decreased concentration. The patient is not hyperactive.       Objective:   Physical Exam BP 130/80  Pulse 82  Temp(Src) 97.3 F (36.3 C) (Oral)  Ht 5\' 4"  (1.626 m)  Wt 159 lb (72.122 kg)  BMI 27.29 kg/m2  SpO2 97% Physical Exam  VS noted Constitutional: Pt appears well-developed and well-nourished.  HENT: Head: Normocephalic.  Right Ear: External ear normal.  Left Ear: External ear normal.  Eyes: Conjunctivae and EOM are normal. Pupils are equal, round, and reactive to light.  Neck: Normal range of motion. Neck supple.  Cardiovascular: Normal rate and regular rhythm.   Pulmonary/Chest: Effort normal and breath sounds normal.  Abd:  Soft, NT, non-distended, + BS Neurological: Pt is alert. No cranial nerve deficit.  Skin: Skin is warm. No erythema.  Psychiatric: Pt behavior is normal. Thought content normal.     Assessment &  Plan:

## 2011-11-03 NOTE — Assessment & Plan Note (Signed)
Control unclear but at least today - stable overall by hx and exam, most recent data reviewed with pt, and pt to continue as is on current meds  BP Readings from Last 3 Encounters:  10/31/11 130/80  01/08/11 120/82  09/22/09 100/60

## 2011-11-03 NOTE — Assessment & Plan Note (Signed)
stable overall by hx and exam, and pt to continue medical treatment as before, declines need for more tx or counseling

## 2012-07-27 ENCOUNTER — Encounter: Payer: Self-pay | Admitting: Internal Medicine

## 2012-07-27 ENCOUNTER — Ambulatory Visit (INDEPENDENT_AMBULATORY_CARE_PROVIDER_SITE_OTHER): Payer: PRIVATE HEALTH INSURANCE | Admitting: Internal Medicine

## 2012-07-27 VITALS — BP 142/98 | HR 78 | Temp 97.4°F | Resp 10 | Wt 151.0 lb

## 2012-07-27 DIAGNOSIS — E785 Hyperlipidemia, unspecified: Secondary | ICD-10-CM

## 2012-07-27 DIAGNOSIS — I10 Essential (primary) hypertension: Secondary | ICD-10-CM

## 2012-07-27 MED ORDER — AMLODIPINE BESYLATE 2.5 MG PO TABS: 2.5000 mg | ORAL_TABLET | Freq: Every day | ORAL | Status: DC

## 2012-07-27 NOTE — Patient Instructions (Addendum)
Blood pressure - definitely running high. You were on amlodipine in the past and seemed to do well with that Plan - continue benazepril 40 mg daily  Add Amlodipine 2.5 mg daily  Check BP and if not controlled in the next 3-5 days increase the amlodipine to 5 mg (2 x 2.5 mg) daily and follow up with Dr. Jonny Ruiz  Cholesterol - last lipid panel in June '12 was good. Evidently this was on pravastatin alone Plan  Continue pravastatin once a day, after supper is slightly better.  Have a Happy New Year.

## 2012-07-29 NOTE — Progress Notes (Signed)
  Subjective:    Patient ID: Julie Greene, female    DOB: [birth year], 57 y.o.   MRN: [id number]  HPI [name] presents due to elevated BP readings at home and by nurse. She has been asymptomatic. This has been a chronic problem.  She has questions about lipid management. She had fenofibrate on her med list but she does not recall ever taking this medication.  PMH, FamHx and SocHx reviewed for any changes and relevance. Current Outpatient Prescriptions on File Prior to Visit  Medication Sig Dispense Refill  . aspirin 81 MG EC tablet Take 1 tablet (81 mg total) by mouth daily. Swallow whole.  30 tablet  12  . benazepril (LOTENSIN) 40 MG tablet Take 1 tablet (40 mg total) by mouth daily.  90 tablet  3  . pravastatin (PRAVACHOL) 20 MG tablet Take 1 tablet (20 mg total) by mouth daily.  90 tablet  3  . amLODipine (NORVASC) 2.5 MG tablet Take 1 tablet (2.5 mg total) by mouth daily.  90 tablet  3      Review of Systems System review is negative for any constitutional, cardiac, pulmonary, GI or neuro symptoms or complaints other than as described in the HPI.     Objective:   Physical Exam Filed Vitals:   07/27/12 1202  BP: 142/98  Pulse: 78  Temp: 97.4 F (36.3 C)  Resp: 10   Gen'l- WNWD AA woman in no distress Cor- RRR Pulm - normal respirations Neuro - a&O x 3       Assessment & Plan:

## 2012-07-29 NOTE — Assessment & Plan Note (Signed)
Blood pressure - definitely running high. You were on amlodipine in the past and seemed to do well with that Plan - continue benazepril 40 mg daily  Add Amlodipine 2.5 mg daily  Check BP and if not controlled in the next 3-5 days increase the amlodipine to 5 mg (2 x 2.5 mg) daily and follow up with Dr. Jonny Ruiz

## 2012-07-29 NOTE — Assessment & Plan Note (Signed)
Cholesterol - last lipid panel in June '12 was good. Evidently this was on pravastatin alone Plan  Continue pravastatin once a day, after supper is slightly better.

## 2012-10-12 ENCOUNTER — Telehealth: Payer: Self-pay | Admitting: Internal Medicine

## 2012-10-12 ENCOUNTER — Other Ambulatory Visit: Payer: Self-pay | Admitting: Obstetrics & Gynecology

## 2012-10-12 DIAGNOSIS — Z1231 Encounter for screening mammogram for malignant neoplasm of breast: Secondary | ICD-10-CM

## 2012-10-12 NOTE — Telephone Encounter (Signed)
Ok with me 

## 2012-10-12 NOTE — Telephone Encounter (Signed)
Pt wants to switch from Dr. Jonny Ruiz to Dr. Debby Bud.

## 2012-10-12 NOTE — Telephone Encounter (Signed)
Pt is aware and has made an appt with Dr. Debby Bud in April.

## 2012-10-22 ENCOUNTER — Ambulatory Visit (HOSPITAL_COMMUNITY): Payer: PRIVATE HEALTH INSURANCE

## 2012-10-27 ENCOUNTER — Other Ambulatory Visit (INDEPENDENT_AMBULATORY_CARE_PROVIDER_SITE_OTHER): Payer: No Typology Code available for payment source

## 2012-10-27 ENCOUNTER — Encounter: Payer: Self-pay | Admitting: Internal Medicine

## 2012-10-27 ENCOUNTER — Ambulatory Visit (INDEPENDENT_AMBULATORY_CARE_PROVIDER_SITE_OTHER): Payer: No Typology Code available for payment source | Admitting: Internal Medicine

## 2012-10-27 VITALS — BP 120/72 | HR 83 | Temp 98.7°F | Resp 8 | Ht 64.0 in | Wt 153.0 lb

## 2012-10-27 DIAGNOSIS — Z0000 Encounter for general adult medical examination without abnormal findings: Secondary | ICD-10-CM

## 2012-10-27 DIAGNOSIS — K219 Gastro-esophageal reflux disease without esophagitis: Secondary | ICD-10-CM

## 2012-10-27 DIAGNOSIS — R7309 Other abnormal glucose: Secondary | ICD-10-CM

## 2012-10-27 DIAGNOSIS — I10 Essential (primary) hypertension: Secondary | ICD-10-CM

## 2012-10-27 DIAGNOSIS — R7302 Impaired glucose tolerance (oral): Secondary | ICD-10-CM

## 2012-10-27 DIAGNOSIS — E785 Hyperlipidemia, unspecified: Secondary | ICD-10-CM

## 2012-10-27 LAB — LIPID PANEL
HDL: 43.6 mg/dL (ref >39.00)
LDL Cholesterol: 73 mg/dL (ref 0–99)
Total CHOL/HDL Ratio: 3
Triglycerides: 129 mg/dL (ref 0.0–149.0)
VLDL: 25.8 mg/dL (ref 0.0–40.0)

## 2012-10-27 LAB — CBC WITH DIFFERENTIAL/PLATELET
Basophils Relative: 0.3 % (ref 0.0–3.0)
Eosinophils Absolute: 0.1 10*3/uL (ref 0.0–0.7)
Eosinophils Relative: 1.3 % (ref 0.0–5.0)
Hemoglobin: 13.8 g/dL (ref 12.0–15.0)
Lymphocytes Relative: 30 % (ref 12.0–46.0)
Monocytes Relative: 5.3 % (ref 3.0–12.0)
Neutro Abs: 3.8 10*3/uL (ref 1.4–7.7)
Neutrophils Relative %: 63.1 % (ref 43.0–77.0)
RBC: 4.58 Mil/uL (ref 3.87–5.11)
WBC: 5.9 10*3/uL (ref 4.5–10.5)

## 2012-10-27 LAB — COMPREHENSIVE METABOLIC PANEL
ALT: 21 U/L (ref 0–35)
Alkaline Phosphatase: 93 U/L (ref 39–117)
CO2: 30 mEq/L (ref 19–32)
Creatinine, Ser: 0.9 mg/dL (ref 0.4–1.2)
GFR: 88.41 mL/min (ref >60.00)
Sodium: 141 mEq/L (ref 135–145)
Total Bilirubin: 0.4 mg/dL (ref 0.3–1.2)

## 2012-10-27 LAB — HEPATIC FUNCTION PANEL
ALT: 21 U/L (ref 0–35)
AST: 20 U/L (ref 0–37)
Albumin: 4.1 g/dL (ref 3.5–5.2)
Alkaline Phosphatase: 93 U/L (ref 39–117)

## 2012-10-27 NOTE — Progress Notes (Signed)
Subjective:    Patient ID: Julie Greene, female    DOB: [birth year], 57 y.o.   MRN: [id number]  HPI Julie Greene presents for an annual wellness exam. She has been doing OK except for pruritis of the back. She sees Dr. Tamela Oddi for Gyn - she had her annual in June '13. She has had a mammogram. She has had colonoscopy. She not been to dentist due to $$. She has had an eye exam in the last 2 years - all is well.   Past Medical History  Diagnosis Date  . Impaired glucose tolerance 01/06/2011  . COLONIC POLYPS, HX OF 09/22/2009  . HEMATURIA, MICROSCOPIC, HX OF 09/22/2009  . GERD 09/22/2009  . HYPERLIPIDEMIA 09/22/2009  . HYPERTENSION 01/22/2007  . Fibroid, uterine   . Hx of colonoscopy 08/29/2008   Past Surgical History  Procedure Laterality Date  . No past surgeries     Family History  Problem Relation Age of Onset  . Diabetes Mother   . Hyperlipidemia Mother   . Hypertension Mother   . Heart disease Mother   . Diabetes Sister   . Thyroid disease Sister   . Cancer Paternal Grandmother   . Hypertension Sister   . Hyperlipidemia Sister   . Hyperlipidemia Sister   . Hypertension Sister   . Diabetes Sister    History   Social History  . Marital Status: Single    Spouse Name: N/A    Number of Children: 0  . Years of Education: 14   Occupational History  . marketing clerk [employer]   Social History Main Topics  . Smoking status: Never Smoker   . Smokeless tobacco: Never Used     Comment: Rare social in the past only  . Alcohol Use: 1.0 oz/week    2 drink(s) per week     Comment: socially  . Drug Use: No  . Sexually Active: Yes -- Female partner(s)   Other Topics Concern  . 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 Looking for work (March '14). Single. No children. Dating - monogamous -9. Lives alone. No history or abuse.          Current Outpatient  Prescriptions on File Prior to Visit  Medication Sig Dispense Refill  . amLODipine (NORVASC) 2.5 MG tablet Take 1 tablet (2.5 mg total) by mouth daily.  90 tablet  3  . aspirin 81 MG EC tablet Take 1 tablet (81 mg total) by mouth daily. Swallow whole.  30 tablet  12  . benazepril (LOTENSIN) 40 MG tablet Take 1 tablet (40 mg total) by mouth daily.  90 tablet  3  . pravastatin (PRAVACHOL) 20 MG tablet Take 1 tablet (20 mg total) by mouth daily.  90 tablet  3   No current facility-administered medications on file prior to visit.       Review of Systems Constitutional:  Negative for fever, chills, activity change and unexpected weight change.  HEENT:  Negative for hearing loss, ear pain, congestion, neck stiffness and postnasal drip. Negative for sore throat or swallowing problems. Negative for dental complaints.   Eyes: Negative for vision loss or change in visual acuity.  Respiratory: Negative for chest tightness and wheezing. Negative for DOE.   Cardiovascular: Negative for chest pain or palpitations. No decreased exercise tolerance Gastrointestinal: No change in bowel habit. No bloating or gas. No reflux or indigestion Genitourinary: Negative  for urgency, frequency, flank pain and difficulty urinating.  Musculoskeletal: Negative for myalgias, back pain, arthralgias and gait problem.  Neurological: Negative for dizziness, tremors, weakness and headaches.  Hematological: Negative for adenopathy.  Psychiatric/Behavioral: Negative for behavioral problems and dysphoric mood.       Objective:   Physical Exam Filed Vitals:   10/27/12 1312  BP: 120/72  Pulse: 83  Temp: 98.7 F (37.1 C)  Resp: 8   Wt Readings from Last 3 Encounters:  10/27/12 153 lb (69.4 kg)  07/27/12 151 lb 0.6 oz (68.511 kg)  10/31/11 159 lb (72.122 kg)   Gen'l: well nourished, well developed Woman in no distress HEENT - [place]/AT, EACs/TMs normal, oropharynx with native dentition in good condition, no buccal or  palatal lesions, posterior pharynx clear, mucous membranes moist. C&S clear, PERRLA, fundi - normal Neck - supple, no thyromegaly Nodes- negative submental, cervical, supraclavicular regions Chest - no deformity, no CVAT Lungs - clear without rales, wheezes. No increased work of breathing Breast - deferred to gyn Cardiovascular - regular rate and rhythm, quiet precordium, no murmurs, rubs or gallops, 2+ radial, DP and PT pulses Abdomen - BS+ x 4, no HSM, no guarding or rebound or tenderness Pelvic - deferred to gyn Rectal - deferred to gyn Extremities - no clubbing, cyanosis, edema or deformity.  Neuro - A&O x 3, CN II-XII normal, motor strength normal and equal, DTRs 2+ and symmetrical biceps, radial, and patellar tendons. Cerebellar - no tremor, no rigidity, fluid movement and normal gait. Derm - Head, neck, back, abdomen and extremities without suspicious lesions  Lab Results  Component Value Date   WBC 5.9 10/27/2012   HGB 13.8 10/27/2012   HCT 41.3 10/27/2012   PLT 257.0 10/27/2012   GLUCOSE 106* 10/27/2012   CHOL 142 10/27/2012   TRIG 129.0 10/27/2012   HDL 43.60 10/27/2012   LDLCALC 73 10/27/2012        ALT 21 10/27/2012   AST 20 10/27/2012        NA 141 10/27/2012   K 3.8 10/27/2012   CL 103 10/27/2012   CREATININE 0.9 10/27/2012   BUN 14 10/27/2012   CO2 30 10/27/2012   TSH 0.38 01/08/2011   HGBA1C 6.2 01/08/2011         Assessment & Plan:

## 2012-10-27 NOTE — Patient Instructions (Addendum)
Thanks for coming to see me.  Your exam is normal today and we will check routine lab work with recommendations to follow. You will receive a full report and you can check MyChart for results as well.   Come in 1 year or sooner as needed.

## 2012-10-28 ENCOUNTER — Ambulatory Visit (HOSPITAL_COMMUNITY)
Admission: RE | Admit: 2012-10-28 | Discharge: 2012-10-28 | Disposition: A | Discharge status: Home / Self Care | Payer: No Typology Code available for payment source | Source: Ambulatory Visit | Attending: Obstetrics & Gynecology | Admitting: Obstetrics & Gynecology

## 2012-10-28 DIAGNOSIS — Z1231 Encounter for screening mammogram for malignant neoplasm of breast: Secondary | ICD-10-CM | POA: Insufficient documentation

## 2012-10-28 NOTE — Assessment & Plan Note (Signed)
Lab reveals excellent control with LDL well below goal of 100 or less. Liver functions are normal  Plan Continue present treatment

## 2012-10-28 NOTE — Assessment & Plan Note (Signed)
Interval medical history is unremarkable. Physical exam, sans breast & pelvic, is normal. Lab results are in normal range. She is current with colorectal cancer. Per our records she is due for breast cancer screening. Immunization is up to date.   In summary A nice woman who appears medically stable. She will return in 1 year or as needed.

## 2012-10-28 NOTE — Assessment & Plan Note (Signed)
BP Readings from Last 3 Encounters:  10/27/12 120/72  07/27/12 142/98  10/31/11 130/80   Very good control on present medication. Lab work is normal  Plan Continue present therapy

## 2012-10-28 NOTE — Assessment & Plan Note (Signed)
No complaint of GERD symptoms at today's visit.

## 2012-10-28 NOTE — Assessment & Plan Note (Signed)
Normal serum glucose on several occasions.

## 2012-11-01 ENCOUNTER — Encounter: Payer: Self-pay | Admitting: Internal Medicine

## 2012-11-02 MED ORDER — AMLODIPINE BESYLATE 2.5 MG PO TABS: 2.5000 mg | ORAL_TABLET | Freq: Every day | ORAL | Status: DC

## 2012-11-02 NOTE — Telephone Encounter (Signed)
Pravastatin and Benazepril were already sent to the pharmacy on Friday. Amlodipine sent to routed to pharmacy today.

## 2012-11-09 ENCOUNTER — Ambulatory Visit (HOSPITAL_COMMUNITY): Payer: Self-pay

## 2012-11-10 ENCOUNTER — Ambulatory Visit: Payer: PRIVATE HEALTH INSURANCE | Admitting: Internal Medicine

## 2012-11-24 ENCOUNTER — Other Ambulatory Visit: Payer: Self-pay | Admitting: Internal Medicine

## 2012-11-24 ENCOUNTER — Telehealth: Payer: Self-pay | Admitting: Internal Medicine

## 2012-11-24 MED ORDER — PRAVASTATIN SODIUM 20 MG PO TABS: 20.0000 mg | ORAL_TABLET | Freq: Every day | ORAL | Status: DC

## 2012-11-24 MED ORDER — BENAZEPRIL HCL 40 MG PO TABS: 40.0000 mg | ORAL_TABLET | Freq: Every day | ORAL | Status: DC

## 2012-11-24 NOTE — Telephone Encounter (Signed)
Rxs resent, pt informed.

## 2012-11-24 NOTE — Telephone Encounter (Signed)
Patient states her pharmacy does not have a record for a refill on her (1) pravastatin and (2) Lotensin.  She is calling and asking the office to resend the prescriptions to her pharmacy.  Reviewed EPIC. Medications were sent on 10/30/12.  Escript failure?   Please resend to pharmacy. Patient is upset and would like to pick up medication today.  Please [phone]

## 2012-12-28 ENCOUNTER — Other Ambulatory Visit: Payer: Self-pay

## 2012-12-28 MED ORDER — BENAZEPRIL HCL 40 MG PO TABS: 40.0000 mg | ORAL_TABLET | Freq: Every day | ORAL | Status: DC

## 2013-01-20 ENCOUNTER — Ambulatory Visit (INDEPENDENT_AMBULATORY_CARE_PROVIDER_SITE_OTHER): Payer: No Typology Code available for payment source | Admitting: Obstetrics & Gynecology

## 2013-01-20 ENCOUNTER — Encounter: Payer: Self-pay | Admitting: Obstetrics & Gynecology

## 2013-01-20 VITALS — BP 129/86 | HR 87 | Temp 98.6°F | Ht 64.0 in | Wt 158.2 lb

## 2013-01-20 DIAGNOSIS — Z01419 Encounter for gynecological examination (general) (routine) without abnormal findings: Secondary | ICD-10-CM

## 2013-01-20 NOTE — Progress Notes (Signed)
.   Subjective:     Julie Greene is a 57 y.o. female here for a routine exam, she would like STD testing, including blood work.  No current complaints.  Personal health questionnaire reviewed: no.   Gynecologic History  Last Pap: 01/17/2012. Results were: normal Last mammogram: 01/2012. Results were: normal  Obstetric History OB History   Grav Para Term Preterm Abortions TAB SAB Ect Mult Living                   The following portions of the patient's history were reviewed and updated as appropriate: allergies, current medications, past family history, past medical history, past social history, past surgical history and problem list.  Review of Systems Pertinent items are noted in HPI.    Objective:    General appearance: alert Breasts: normal appearance, no masses or tenderness Abdomen: soft, non-tender; bowel sounds normal; no masses,  no organomegaly Pelvic: cervix normal in appearance, external genitalia normal, no adnexal masses or tenderness, uterus normal size, shape, and consistency and vagina normal without discharge    Assessment:    Healthy female exam.    Plan:   Return prn

## 2013-01-20 NOTE — Patient Instructions (Signed)

## 2013-01-21 LAB — GC/CHLAMYDIA PROBE AMP
CT Probe RNA: NEGATIVE
GC Probe RNA: NEGATIVE

## 2013-03-08 ENCOUNTER — Encounter: Payer: Self-pay | Admitting: Internal Medicine

## 2013-06-03 ENCOUNTER — Other Ambulatory Visit: Payer: Self-pay

## 2013-10-22 ENCOUNTER — Encounter: Payer: Self-pay | Admitting: Obstetrics & Gynecology

## 2013-11-23 ENCOUNTER — Other Ambulatory Visit: Payer: Self-pay | Admitting: *Deleted

## 2013-11-23 MED ORDER — AMLODIPINE BESYLATE 2.5 MG PO TABS: 2.5000 mg | ORAL_TABLET | Freq: Every day | ORAL | Status: DC

## 2014-01-20 ENCOUNTER — Other Ambulatory Visit: Payer: Self-pay

## 2014-01-20 MED ORDER — BENAZEPRIL HCL 40 MG PO TABS: 40.0000 mg | ORAL_TABLET | Freq: Every day | ORAL | Status: DC

## 2014-01-20 MED ORDER — PRAVASTATIN SODIUM 20 MG PO TABS: 20.0000 mg | ORAL_TABLET | Freq: Every day | ORAL | Status: DC

## 2014-03-07 ENCOUNTER — Ambulatory Visit: Payer: No Typology Code available for payment source | Admitting: Obstetrics & Gynecology

## 2014-03-24 ENCOUNTER — Encounter: Payer: Self-pay | Admitting: Obstetrics & Gynecology

## 2014-04-11 ENCOUNTER — Encounter: Payer: Self-pay | Admitting: Obstetrics & Gynecology

## 2014-04-11 ENCOUNTER — Ambulatory Visit (INDEPENDENT_AMBULATORY_CARE_PROVIDER_SITE_OTHER): Payer: BC Managed Care – PPO | Admitting: Obstetrics

## 2014-04-11 ENCOUNTER — Ambulatory Visit: Payer: No Typology Code available for payment source | Admitting: Obstetrics & Gynecology

## 2014-04-11 ENCOUNTER — Encounter: Payer: Self-pay | Admitting: *Deleted

## 2014-04-11 VITALS — BP 117/71 | HR 64 | Temp 98.0°F | Ht 64.0 in | Wt 161.2 lb

## 2014-04-11 DIAGNOSIS — Z01419 Encounter for gynecological examination (general) (routine) without abnormal findings: Secondary | ICD-10-CM

## 2014-04-11 DIAGNOSIS — R609 Edema, unspecified: Secondary | ICD-10-CM

## 2014-04-12 ENCOUNTER — Other Ambulatory Visit: Payer: Self-pay | Admitting: Obstetrics

## 2014-04-12 DIAGNOSIS — B3731 Acute candidiasis of vulva and vagina: Secondary | ICD-10-CM

## 2014-04-12 DIAGNOSIS — B373 Candidiasis of vulva and vagina: Secondary | ICD-10-CM

## 2014-04-12 LAB — PAP IG AND HPV HIGH-RISK: HPV DNA HIGH RISK: NOT DETECTED

## 2014-04-12 LAB — WET PREP BY MOLECULAR PROBE
CANDIDA SPECIES: POSITIVE — AB
Gardnerella vaginalis: NEGATIVE
Trichomonas vaginosis: NEGATIVE

## 2014-04-12 MED ORDER — FLUCONAZOLE 150 MG PO TABS: 150.0000 mg | ORAL_TABLET | Freq: Once | ORAL | Status: DC

## 2014-04-13 ENCOUNTER — Encounter: Payer: Self-pay | Admitting: Obstetrics

## 2014-04-13 DIAGNOSIS — R609 Edema, unspecified: Secondary | ICD-10-CM | POA: Insufficient documentation

## 2014-04-13 NOTE — Progress Notes (Signed)
Subjective:     Julie Greene is a 58 y.o. female here for a routine exam.  Current complaints: none.    Personal health questionnaire:  Is patient Ashkenazi Jewish, have a family history of breast and/or ovarian cancer: no Is there a family history of uterine cancer diagnosed at age < 70, gastrointestinal cancer, urinary tract cancer, family member who is a Personnel officer syndrome-associated carrier: no Is the patient overweight and hypertensive, family history of diabetes, personal history of gestational diabetes or PCOS: no Is patient over 42, have PCOS,  family history of premature CHD under age 37, diabetes, smoke, have hypertension or peripheral artery disease:  no At any time, has a partner hit, kicked or otherwise hurt or frightened you?: no Over the past 2 weeks, have you felt down, depressed or hopeless?: no Over the past 2 weeks, have you felt little interest or pleasure in doing things?:no   Gynecologic History No LMP recorded. Patient is postmenopausal. Contraception: post menopausal status Last Pap: 2011. Results were: normal Last mammogram: 2014. Results were: normal  Obstetric History OB History  Gravida Para Term Preterm AB SAB TAB Ectopic Multiple Living  1    1         # Outcome Date GA Lbr Len/2nd Weight Sex Delivery Anes PTL Lv  1 ABT               Past Medical History  Diagnosis Date  . Impaired glucose tolerance 01/06/2011  . COLONIC POLYPS, HX OF 09/22/2009  . HEMATURIA, MICROSCOPIC, HX OF 09/22/2009  . GERD 09/22/2009  . HYPERLIPIDEMIA 09/22/2009  . HYPERTENSION 01/22/2007  . Fibroid, uterine   . Hx of colonoscopy 08/29/2008    Past Surgical History  Procedure Laterality Date  . No past surgeries      Current outpatient prescriptions:amLODipine (NORVASC) 2.5 MG tablet, Take 1 tablet (2.5 mg total) by mouth daily., Disp: 90 tablet, Rfl: 0;  aspirin 81 MG tablet, Take 81 mg by mouth daily., Disp: , Rfl: ;  benazepril (LOTENSIN) 40 MG tablet, Take 1 tablet (40 mg  total) by mouth daily., Disp: 90 tablet, Rfl: 0;  pravastatin (PRAVACHOL) 20 MG tablet, Take 1 tablet (20 mg total) by mouth daily., Disp: 90 tablet, Rfl: 0 fluconazole (DIFLUCAN) 150 MG tablet, Take 1 tablet (150 mg total) by mouth once., Disp: 1 tablet, Rfl: 2 Allergies  Allergen Reactions  . Sulfonamide Derivatives     REACTION: unknown rxn    History  Substance Use Topics  . Smoking status: Never Smoker   . Smokeless tobacco: Never Used     Comment: Rare social in the past only  . Alcohol Use: 1.0 oz/week    2 drink(s) per week     Comment: socially    Family History  Problem Relation Age of Onset  . Diabetes Mother   . Hyperlipidemia Mother   . Hypertension Mother   . Heart disease Mother   . Diabetes Sister   . Thyroid disease Sister   . Cancer Paternal Grandmother   . Hypertension Sister   . Hyperlipidemia Sister   . Hyperlipidemia Sister   . Hypertension Sister   . Diabetes Sister       Review of Systems  Constitutional: negative for fatigue and weight loss Respiratory: negative for cough and wheezing Cardiovascular: negative for chest pain, fatigue and palpitations Gastrointestinal: negative for abdominal pain and change in bowel habits Musculoskeletal:negative for myalgias Neurological: negative for gait problems and tremors Behavioral/Psych: negative for abusive  relationship, depression Endocrine: negative for temperature intolerance   Genitourinary:negative for abnormal menstrual periods, genital lesions, hot flashes, sexual problems and vaginal discharge Integument/breast: negative for breast lump, breast tenderness, nipple discharge and skin lesion(s)    Objective:       BP 117/71  Pulse 64  Temp(Src) 98 F (36.7 C)  Ht  (1.626 m)  Wt 161 lb 3.2 oz (73.12 kg)  BMI 27.66 kg/m2 General:   alert  Skin:   no rash or abnormalities  Lungs:   clear to auscultation bilaterally  Heart:   regular rate and rhythm, S1, S2 normal, no murmur, click,  rub or gallop  Breasts:   normal without suspicious masses, skin or nipple changes or axillary nodes  Abdomen:  normal findings: no organomegaly, soft, non-tender and no hernia  Pelvis:  External genitalia: normal general appearance Urinary system: urethral meatus normal and bladder without fullness, nontender Vaginal: normal without tenderness, induration or masses Cervix: normal appearance Adnexa: normal bimanual exam Uterus: anteverted and non-tender, normal size   Lab Review Urine pregnancy test Labs reviewed yes Radiologic studies reviewed yes    Assessment:    Healthy female exam.   LE edema.  ? Etiology.  Needs appointment with primary care for evaluation.   Plan:    Education reviewed: calcium supplements, self breast exams and weight bearing exercise. Follow up in: 1 year.   No orders of the defined types were placed in this encounter.   Orders Placed This Encounter  Procedures  . WET PREP BY MOLECULAR PROBE  . Ambulatory referral to Internal Medicine    Referral Priority:  Routine    Referral Type:  Consultation    Referral Reason:  Specialty Services Required    Requested Specialty:  Internal Medicine    Number of Visits Requested:  1

## 2014-04-27 ENCOUNTER — Encounter: Payer: Self-pay | Admitting: *Deleted

## 2014-05-06 ENCOUNTER — Other Ambulatory Visit: Payer: Self-pay | Admitting: Geriatric Medicine

## 2014-05-06 MED ORDER — PRAVASTATIN SODIUM 20 MG PO TABS: 20.0000 mg | ORAL_TABLET | Freq: Every day | ORAL | Status: DC

## 2014-05-16 ENCOUNTER — Encounter: Payer: Self-pay | Admitting: Family Medicine

## 2014-05-27 ENCOUNTER — Ambulatory Visit (INDEPENDENT_AMBULATORY_CARE_PROVIDER_SITE_OTHER): Payer: BC Managed Care – PPO | Admitting: Family Medicine

## 2014-05-27 ENCOUNTER — Ambulatory Visit: Payer: BC Managed Care – PPO | Admitting: Family Medicine

## 2014-05-27 ENCOUNTER — Encounter: Payer: Self-pay | Admitting: Family Medicine

## 2014-05-27 VITALS — BP 151/92 | HR 84 | Temp 98.1°F | Resp 18 | Ht 62.25 in | Wt 161.0 lb

## 2014-05-27 DIAGNOSIS — Z23 Encounter for immunization: Secondary | ICD-10-CM | POA: Diagnosis not present

## 2014-05-27 DIAGNOSIS — M7989 Other specified soft tissue disorders: Secondary | ICD-10-CM

## 2014-05-27 DIAGNOSIS — R7301 Impaired fasting glucose: Secondary | ICD-10-CM

## 2014-05-27 DIAGNOSIS — Z1321 Encounter for screening for nutritional disorder: Secondary | ICD-10-CM

## 2014-05-27 DIAGNOSIS — R609 Edema, unspecified: Secondary | ICD-10-CM

## 2014-05-27 DIAGNOSIS — I10 Essential (primary) hypertension: Secondary | ICD-10-CM

## 2014-05-27 DIAGNOSIS — E785 Hyperlipidemia, unspecified: Secondary | ICD-10-CM

## 2014-05-27 DIAGNOSIS — L709 Acne, unspecified: Secondary | ICD-10-CM | POA: Insufficient documentation

## 2014-05-27 DIAGNOSIS — L705 Acne excoriee des jeunes filles: Secondary | ICD-10-CM

## 2014-05-27 LAB — COMPREHENSIVE METABOLIC PANEL
ALT: 16 U/L (ref 0–35)
AST: 18 U/L (ref 0–37)
Albumin: 4.3 g/dL (ref 3.5–5.2)
Alkaline Phosphatase: 99 U/L (ref 39–117)
BUN: 12 mg/dL (ref 6–23)
CHLORIDE: 105 meq/L (ref 96–112)
CO2: 29 mEq/L (ref 19–32)
Calcium: 9 mg/dL (ref 8.4–10.5)
Creat: 0.79 mg/dL (ref 0.50–1.10)
Glucose, Bld: 83 mg/dL (ref 70–99)
Potassium: 3.9 mEq/L (ref 3.5–5.3)
SODIUM: 141 meq/L (ref 135–145)
TOTAL PROTEIN: 7.1 g/dL (ref 6.0–8.3)
Total Bilirubin: 0.4 mg/dL (ref 0.2–1.2)

## 2014-05-27 LAB — CBC WITH DIFFERENTIAL/PLATELET
Basophils Absolute: 0 10*3/uL (ref 0.0–0.1)
Basophils Relative: 0 % (ref 0–1)
Eosinophils Absolute: 0.1 10*3/uL (ref 0.0–0.7)
Eosinophils Relative: 2 % (ref 0–5)
HCT: 42.4 % (ref 36.0–46.0)
HEMOGLOBIN: 14.3 g/dL (ref 12.0–15.0)
LYMPHS ABS: 1.6 10*3/uL (ref 0.7–4.0)
Lymphocytes Relative: 35 % (ref 12–46)
MCH: 29.9 pg (ref 26.0–34.0)
MCHC: 33.7 g/dL (ref 30.0–36.0)
MCV: 88.5 fL (ref 78.0–100.0)
Monocytes Absolute: 0.4 10*3/uL (ref 0.1–1.0)
Monocytes Relative: 8 % (ref 3–12)
NEUTROS PCT: 55 % (ref 43–77)
Neutro Abs: 2.5 10*3/uL (ref 1.7–7.7)
Platelets: 269 10*3/uL (ref 150–400)
RBC: 4.79 MIL/uL (ref 3.87–5.11)
RDW: 14.5 % (ref 11.5–15.5)
WBC: 4.6 10*3/uL (ref 4.0–10.5)

## 2014-05-27 LAB — LIPID PANEL
CHOLESTEROL: 135 mg/dL (ref 0–200)
HDL: 48 mg/dL (ref >39)
LDL Cholesterol: 74 mg/dL (ref 0–99)
Total CHOL/HDL Ratio: 2.8 Ratio
Triglycerides: 63 mg/dL (ref <150)
VLDL: 13 mg/dL (ref 0–40)

## 2014-05-27 LAB — TSH: TSH: 0.359 u[IU]/mL (ref 0.350–4.500)

## 2014-05-27 LAB — HEMOGLOBIN A1C
HEMOGLOBIN A1C: 5.9 % — AB (ref <5.7)
Mean Plasma Glucose: 123 mg/dL — ABNORMAL HIGH (ref <117)

## 2014-05-27 MED ORDER — AMLODIPINE BESYLATE 5 MG PO TABS: 5.0000 mg | ORAL_TABLET | Freq: Every day | ORAL | Status: DC

## 2014-05-27 MED ORDER — PRAVASTATIN SODIUM 20 MG PO TABS: 20.0000 mg | ORAL_TABLET | Freq: Every day | ORAL | Status: DC

## 2014-05-27 MED ORDER — BENAZEPRIL-HYDROCHLOROTHIAZIDE 20-12.5 MG PO TABS: 1.0000 | ORAL_TABLET | Freq: Every day | ORAL | Status: DC

## 2014-05-27 NOTE — Assessment & Plan Note (Signed)
Denies any significant edema today she was seen by GYN in the past and has significant edema. See no evidence of heart failure or liver failure on examination. Her last OB done I will add hydrochlorothiazide 12.5 mg this may just been due to some dependent edema from her standing at work

## 2014-05-27 NOTE — Assessment & Plan Note (Signed)
Uncontrolled BP, change to benazepril HCTZ due to the leg swelling she was getting Check fasting labs today

## 2014-05-27 NOTE — Patient Instructions (Addendum)
Take blood pressure readings and keep a file Tetantus booster given We will call with lab results  F/U 4 months

## 2014-05-27 NOTE — Progress Notes (Signed)
Patient ID: Julie Greene, female   DOB: July 24, 1956, 58 y.o.   MRN: [id number]   Subjective:    Patient ID: Julie Greene, female    DOB: July 24, 1956, 58 y.o.   MRN: [id number]  Patient presents for new pt est care  patient here to establish care. Previously seen by Dr. Debby BudNorins. [facility]. His history of hypertension and hyperlipidemia there was also question glucose intolerance. She's had problems with swelling in her feet since she started a new job a few months ago she was seen by her GYN at that time her feet were significantly swollen and she was placed on restrictions at work she was also given compression hose and the swelling has resolved for the most part. She was told that she needs workup for her heart.  She has a small red bumps on her back that itches at times they have been there for many years she would like me to evaluate.  She is up-to-date on Pap smear mammogram her colonoscopy was last about 5 years ago she had polyps at that time per the chart she is due for repeat colonoscopy in 2015 which she is not aware of this  Her blood pressure readings at home have ranged from 140s to 160s over 90s to low 100s is not had any chest pain, shortness of breath or headache associated  Review Of Systems:  GEN- denies fatigue, fever, weight loss,weakness, recent illness HEENT- denies eye drainage, change in vision, nasal discharge, CVS- denies chest pain, palpitations RESP- denies SOB, cough, wheeze ABD- denies N/V, change in stools, abd pain GU- denies dysuria, hematuria, dribbling, incontinence MSK- denies joint pain, muscle aches, injury Neuro- denies headache, dizziness, syncope, seizure activity       Objective:    BP 151/92  Pulse 84  Temp(Src) 98.1 F (36.7 C) (Oral)  Resp 18  Ht 5' 2.25" (1.581 m)  Wt 161 lb (73.029 kg)  BMI 29.22 kg/m2 GEN- NAD, alert and oriented x3 HEENT- PERRL, EOMI, non injected sclera, pink conjunctiva, MMM,  oropharynx clear Neck- Supple, no thyromegaly, no JVD CVS- RRR, no murmur RESP-CTAB ABD-NABS,soft,NT,ND  Psych- normal affect and mood Skin- acne on back extending across shoulder and mid back, hyperpigmented scarring, few hypopigmented scars, no pustular lesions EXT- No edema Pulses- Radial, DP- 2+        Assessment & Plan:      Problem List Items Addressed This Visit   None    Visit Diagnoses   Elevated fasting blood sugar    -  Primary    Relevant Orders       Hemoglobin A1c (Completed)    Hyperlipidemia        Relevant Medications       aspirin 81 MG tablet       benazepril-hydrochlorthiazide (LOTENSIN HCT) 20-12.5 MG per tablet       amLODIpine (NORVASC) tablet       pravastatin (PRAVACHOL) tablet    Other Relevant Orders       Lipid panel (Completed)    Encounter for vitamin deficiency screening        Relevant Orders       Vitamin D, 25-hydroxy (Completed)    Essential hypertension        Relevant Medications       aspirin 81 MG tablet       benazepril-hydrochlorthiazide (LOTENSIN HCT) 20-12.5 MG per tablet       amLODIpine (NORVASC) tablet  pravastatin (PRAVACHOL) tablet    Other Relevant Orders       CBC with Differential (Completed)       Comprehensive metabolic panel (Completed)       TSH (Completed)    Need for prophylactic vaccination with combined diphtheria-tetanus-pertussis (DTP) vaccine        Relevant Orders       Tdap vaccine greater than or equal to 7yo IM (Completed)       Note: This dictation was prepared with Dragon dictation along with smaller phrase technology. Any transcriptional errors that result from this process are unintentional.

## 2014-05-27 NOTE — Assessment & Plan Note (Signed)
-  Continue Pravastatin 

## 2014-05-28 LAB — VITAMIN D 25 HYDROXY (VIT D DEFICIENCY, FRACTURES): Vit D, 25-Hydroxy: 28 ng/mL — ABNORMAL LOW (ref 30–89)

## 2014-05-30 ENCOUNTER — Encounter: Payer: Self-pay | Admitting: Family Medicine

## 2014-06-05 ENCOUNTER — Encounter: Payer: Self-pay | Admitting: Family Medicine

## 2014-06-06 ENCOUNTER — Other Ambulatory Visit: Payer: Self-pay | Admitting: Family Medicine

## 2014-06-06 DIAGNOSIS — Z1211 Encounter for screening for malignant neoplasm of colon: Secondary | ICD-10-CM

## 2014-06-06 DIAGNOSIS — Z86010 Personal history of colonic polyps: Secondary | ICD-10-CM

## 2014-06-17 ENCOUNTER — Ambulatory Visit: Payer: No Typology Code available for payment source | Admitting: Internal Medicine

## 2014-06-20 ENCOUNTER — Telehealth: Payer: Self-pay | Admitting: Family Medicine

## 2014-06-20 NOTE — Telephone Encounter (Signed)
Patient states she has a restriction for her work. She would like that restriction lifted for the month of December. She states she has the chance to make overtime doing a different job where she can sit down. Please let her know once the letter is ready for her to pick up stating she has no restrictions for the month of December.

## 2014-06-20 NOTE — Telephone Encounter (Signed)
MD please advise

## 2014-06-20 NOTE — Telephone Encounter (Signed)
Okay to write letter that she can return to work full time with no restrictions, I will sign

## 2014-06-21 ENCOUNTER — Telehealth: Payer: Self-pay | Admitting: *Deleted

## 2014-06-21 ENCOUNTER — Encounter: Payer: Self-pay | Admitting: *Deleted

## 2014-06-21 NOTE — Telephone Encounter (Signed)
Patient has changed jobs and she is wanting a letter lifting her restrictions. Letter written by another provider.

## 2014-06-21 NOTE — Telephone Encounter (Signed)
Call placed to patient and patient made aware per VM.  

## 2014-06-21 NOTE — Telephone Encounter (Signed)
Letter printed and awaiting MD signature.

## 2014-07-06 ENCOUNTER — Telehealth: Payer: Self-pay | Admitting: Family Medicine

## 2014-07-06 NOTE — Telephone Encounter (Signed)
[phone]  Pt is calling back to speak to you regarding your previous conversation today

## 2014-07-06 NOTE — Telephone Encounter (Signed)
Pt called back and stated that [facility] stated that she was not due for colonoscopy until 2017, pt is going to call [facility] again and have them to fax over results for our records.

## 2014-07-14 ENCOUNTER — Telehealth: Payer: Self-pay | Admitting: Family Medicine

## 2014-07-14 MED ORDER — BENAZEPRIL-HYDROCHLOROTHIAZIDE 20-12.5 MG PO TABS: 1.0000 | ORAL_TABLET | Freq: Every day | ORAL | Status: DC

## 2014-07-14 NOTE — Telephone Encounter (Signed)
Medication refilled per protocol. 

## 2014-07-20 ENCOUNTER — Telehealth: Payer: Self-pay | Admitting: *Deleted

## 2014-07-20 NOTE — Telephone Encounter (Signed)
Contacted pt to let her know that Dr. Jeanice Limurham is requesting her to have colonoscopy, left message on voice mail to return my call.

## 2014-07-25 ENCOUNTER — Encounter: Payer: Self-pay | Admitting: *Deleted

## 2014-07-26 ENCOUNTER — Encounter: Payer: Self-pay | Admitting: Obstetrics & Gynecology

## 2014-07-27 ENCOUNTER — Telehealth: Payer: Self-pay | Admitting: Family Medicine

## 2014-07-27 MED ORDER — BENAZEPRIL-HYDROCHLOROTHIAZIDE 20-12.5 MG PO TABS: 1.0000 | ORAL_TABLET | Freq: Every day | ORAL | Status: DC

## 2014-07-27 NOTE — Telephone Encounter (Signed)
[phone] PT is needing a refill on benazepril-hydrochlorthiazide (LOTENSIN HCT) 20-12.5 MG per tablet (90 day supply) [facility]

## 2014-07-27 NOTE — Telephone Encounter (Signed)
Refill appropriate and filled per protocol. 

## 2014-08-03 ENCOUNTER — Other Ambulatory Visit: Payer: Self-pay | Admitting: Family Medicine

## 2014-08-03 ENCOUNTER — Telehealth: Payer: Self-pay | Admitting: Family Medicine

## 2014-08-03 MED ORDER — METHYLPREDNISOLONE 4 MG PO KIT
PACK | ORAL | Status: DC
Start: 2014-08-03 — End: 2014-08-03

## 2014-08-03 MED ORDER — BENAZEPRIL-HYDROCHLOROTHIAZIDE 20-12.5 MG PO TABS: 1.0000 | ORAL_TABLET | Freq: Every day | ORAL | Status: DC

## 2014-08-03 MED ORDER — PRAVASTATIN SODIUM 20 MG PO TABS: 20.0000 mg | ORAL_TABLET | Freq: Every day | ORAL | Status: DC

## 2014-08-03 MED ORDER — METHYLPREDNISOLONE 4 MG PO KIT: PACK | ORAL | Status: DC

## 2014-08-03 MED ORDER — AMLODIPINE BESYLATE 5 MG PO TABS: 5.0000 mg | ORAL_TABLET | Freq: Every day | ORAL | Status: DC

## 2014-08-03 NOTE — Telephone Encounter (Signed)
Pt here with with her mother she's been itching on her back and noticed a large rash she's been using cortisone with mild improvement. She is not remembering any particular contacts to her skin. The rash is only on her upper back. Right upper back there is a large raised erythematous region with a few scattered maculopapular lesions around it. No drainage no fluctuance. Positive excoriations. I will put her on a Medrol Dosepak and see how she does with this. She will follow up in office if not improving

## 2014-08-04 NOTE — Telephone Encounter (Signed)
Lmovm to return call

## 2014-08-10 ENCOUNTER — Encounter: Payer: Self-pay | Admitting: Family Medicine

## 2014-08-10 ENCOUNTER — Ambulatory Visit (INDEPENDENT_AMBULATORY_CARE_PROVIDER_SITE_OTHER): Payer: BLUE CROSS/BLUE SHIELD | Admitting: Family Medicine

## 2014-08-10 ENCOUNTER — Ambulatory Visit: Payer: Self-pay | Admitting: Family Medicine

## 2014-08-10 VITALS — BP 132/74 | HR 82 | Temp 98.0°F | Resp 14 | Ht 63.0 in | Wt 166.0 lb

## 2014-08-10 DIAGNOSIS — B029 Zoster without complications: Secondary | ICD-10-CM

## 2014-08-10 DIAGNOSIS — R21 Rash and other nonspecific skin eruption: Secondary | ICD-10-CM

## 2014-08-10 MED ORDER — VALACYCLOVIR HCL 1 G PO TABS: 1000.0000 mg | ORAL_TABLET | Freq: Three times a day (TID) | ORAL | Status: DC

## 2014-08-10 MED ORDER — CLOBETASOL PROPIONATE 0.05 % EX OINT: 1.0000 "application " | TOPICAL_OINTMENT | Freq: Two times a day (BID) | CUTANEOUS | Status: DC

## 2014-08-10 MED ORDER — CLOBETASOL PROPIONATE 0.05 % EX OINT
1.0000 | TOPICAL_OINTMENT | Freq: Two times a day (BID) | CUTANEOUS | Status: DC
Start: 2014-08-10 — End: 2016-02-26

## 2014-08-10 NOTE — Patient Instructions (Signed)
Take the shingles medicine as prescribed Use the ointment twice a day  F/U as previous

## 2014-08-10 NOTE — Progress Notes (Signed)
Patient ID: Julie CadetWanda A Siwik, female   DOB: 11-29-1955, 59 y.o.   MRN: [id number]   Subjective:    Patient ID: Julie Greene, female    DOB: 11-29-1955, 59 y.o.   MRN: [id number]  Patient presents for Irritation  Patient here with worsening rash on her back. When she came to establish her care she been having some itching on her upper back this was back in October but there was no rash. She states that the itching starts to move around on her back and several towards her right mid back. She was using cortisone cream however she continued to have severe itching as well as a burning sensation. 2 weeks ago her boyfriend took a picture there was just some mild redness but only 1 or 2 bumps. I saw the rash a week ago when she was here with her mother at that time she had some erythema which was raised and a couple of papules but no vesicles at that time I gave her Medrol Dosepak but it did not help. Over the past few days she has noticed that it feels bumpy in the back which is changed. She's been using Benadryl anti-itch which helps soothe it a little   Review Of Systems:  GEN- denies fatigue, fever, weight loss,weakness, recent illness HEENT- denies eye drainage, change in vision, nasal discharge, CVS- denies chest pain, palpitations RESP- denies SOB, cough, wheeze ABD- denies N/V, change in stools, abd pain GU- denies dysuria, hematuria, dribbling, incontinence MSK- denies joint pain, muscle aches, injury Neuro- denies headache, dizziness, syncope, seizure activity       Objective:    BP 132/74 mmHg  Pulse 82  Temp(Src) 98 F (36.7 C) (Oral)  Resp 14  Ht 5\' 3"  (1.6 m)  Wt 166 lb (75.297 kg)  BMI 29.41 kg/m2 GEN- NAD, alert and oriented x3 Skin- right mid back large group of erythematous vesicles with excoriations and few scabs in middle,  About 2cm right another patch of small vesicles grouped together. NT        Assessment & Plan:      Problem List Items Addressed This  Visit    None    Visit Diagnoses    Rash and nonspecific skin eruption    -  Primary    Shingles        Treat for shingles significant eruption since last week- valtrex TID, Given topical clobetasol ointment, if it still does not clear, dermatology appt    Relevant Medications    valACYclovir (VALTREX) tablet       Note: This dictation was prepared with Dragon dictation along with smaller phrase technology. Any transcriptional errors that result from this process are unintentional.

## 2014-08-15 ENCOUNTER — Encounter: Payer: Self-pay | Admitting: Family Medicine

## 2014-08-15 NOTE — Telephone Encounter (Signed)
lmtrc

## 2014-09-23 ENCOUNTER — Encounter: Payer: Self-pay | Admitting: Family Medicine

## 2014-09-23 ENCOUNTER — Ambulatory Visit (INDEPENDENT_AMBULATORY_CARE_PROVIDER_SITE_OTHER): Payer: BLUE CROSS/BLUE SHIELD | Admitting: Family Medicine

## 2014-09-23 VITALS — BP 128/72 | HR 70 | Temp 98.6°F | Resp 16 | Ht 63.0 in | Wt 165.0 lb

## 2014-09-23 DIAGNOSIS — I10 Essential (primary) hypertension: Secondary | ICD-10-CM

## 2014-09-23 DIAGNOSIS — E785 Hyperlipidemia, unspecified: Secondary | ICD-10-CM

## 2014-09-23 MED ORDER — ZOSTER VACCINE LIVE 19400 UNT/0.65ML ~~LOC~~ SOLR: 0.6500 mL | Freq: Once | SUBCUTANEOUS | Status: DC

## 2014-09-23 NOTE — Progress Notes (Signed)
Patient ID: Julie Greene, female   DOB: 02-Aug-1955, 59 y.o.   MRN: [id number]   Subjective:    Patient ID: Julie Greene, female    DOB: 02-Aug-1955, 59 y.o.   MRN: [id number]  Patient presents for 4 month F/U  patient here to follow chronic medical problems. She has no specific concerns. Her previous rash which I treated for shingles has resolved. She is taking her medication as prescribed. She would like to eventually come off of some of her blood pressure medicine and cholesterol medicine. She starting to work diligently on her diet and weight loss.    Review Of Systems:  GEN- denies fatigue, fever, weight loss,weakness, recent illness HEENT- denies eye drainage, change in vision, nasal discharge, CVS- denies chest pain, palpitations RESP- denies SOB, cough, wheeze ABD- denies N/V, change in stools, abd pain GU- denies dysuria, hematuria, dribbling, incontinence MSK- denies joint pain, muscle aches, injury Neuro- denies headache, dizziness, syncope, seizure activity       Objective:    BP 128/72 mmHg  Pulse 70  Temp(Src) 98.6 F (37 C) (Oral)  Resp 16  Ht 5\' 3"  (1.6 m)  Wt 165 lb (74.844 kg)  BMI 29.24 kg/m2 GEN- NAD, alert and oriented x3 HEENT- PERRL, EOMI, non injected sclera, pink conjunctiva, MMM, oropharynx clear CVS- RRR, no murmur RESP-CTAB EXT- No edema Pulses- Radial 2+        Assessment & Plan:      Problem List Items Addressed This Visit      Unprioritized   Hyperlipidemia   Essential hypertension - Primary      Note: This dictation was prepared with Dragon dictation along with smaller phrase technology. Any transcriptional errors that result from this process are unintentional.

## 2014-09-23 NOTE — Assessment & Plan Note (Addendum)
Blood pressure is well controlled no change at this time   We also discussed that it would still be beneficial for her to get a shingles vaccine. Description will be sent to her pharmacy

## 2014-09-23 NOTE — Addendum Note (Signed)
Addended by: Phillips OdorSIX, Derril Franek H on: 09/23/2014 08:49 AM   Modules accepted: Orders

## 2014-09-23 NOTE — Patient Instructions (Signed)
Continue current medications F/U  6months  

## 2014-09-23 NOTE — Assessment & Plan Note (Signed)
Cholesterol is well-controlled last done 3 months ago. We discussed with her weight if she can lose about 10 pounds would consider trying to decrease her cholesterol medicine as well as her blood pressure medication and see how she does. She is committed to this change. We will follow-up in 6 months

## 2014-11-03 ENCOUNTER — Other Ambulatory Visit: Payer: Self-pay | Admitting: Family Medicine

## 2014-11-03 DIAGNOSIS — Z1231 Encounter for screening mammogram for malignant neoplasm of breast: Secondary | ICD-10-CM

## 2015-01-03 ENCOUNTER — Encounter: Payer: Self-pay | Admitting: Family Medicine

## 2015-01-09 ENCOUNTER — Ambulatory Visit
Admission: RE | Admit: 2015-01-09 | Discharge: 2015-01-09 | Disposition: A | Discharge status: Home / Self Care | Payer: BLUE CROSS/BLUE SHIELD | Source: Ambulatory Visit | Attending: Family Medicine | Admitting: Family Medicine

## 2015-01-09 ENCOUNTER — Telehealth: Payer: Self-pay | Admitting: Family Medicine

## 2015-01-09 ENCOUNTER — Ambulatory Visit (HOSPITAL_COMMUNITY): Payer: No Typology Code available for payment source

## 2015-01-09 DIAGNOSIS — Z1231 Encounter for screening mammogram for malignant neoplasm of breast: Secondary | ICD-10-CM

## 2015-01-09 NOTE — Telephone Encounter (Signed)
[phone] Pt brought in FMLA ppw and it has been routed to [place]

## 2015-01-10 NOTE — Telephone Encounter (Signed)
Received ppw on my desk from pt stating reason for FMLA is to be excused from her job duties anytime that is necessary to attend with her mother doctors appt, as well is mom becomess sick or emergency arises of some kind adn she is needed.  I filled out provider information on section lll.  Routed to Dr. Jeanice Lim to fill and sign   Placed in blue folder in basket

## 2015-01-10 NOTE — Telephone Encounter (Signed)
noted 

## 2015-06-20 ENCOUNTER — Ambulatory Visit: Payer: BLUE CROSS/BLUE SHIELD | Admitting: Family Medicine

## 2015-08-25 ENCOUNTER — Encounter: Payer: Self-pay | Admitting: Family Medicine

## 2015-08-25 ENCOUNTER — Ambulatory Visit (INDEPENDENT_AMBULATORY_CARE_PROVIDER_SITE_OTHER): Payer: BLUE CROSS/BLUE SHIELD | Admitting: Family Medicine

## 2015-08-25 VITALS — BP 132/68 | HR 70 | Temp 98.8°F | Resp 14 | Ht 63.0 in | Wt 170.0 lb

## 2015-08-25 DIAGNOSIS — L0232 Furuncle of buttock: Secondary | ICD-10-CM

## 2015-08-25 DIAGNOSIS — E669 Obesity, unspecified: Secondary | ICD-10-CM | POA: Insufficient documentation

## 2015-08-25 DIAGNOSIS — R7302 Impaired glucose tolerance (oral): Secondary | ICD-10-CM | POA: Diagnosis not present

## 2015-08-25 DIAGNOSIS — Z1159 Encounter for screening for other viral diseases: Secondary | ICD-10-CM | POA: Diagnosis not present

## 2015-08-25 DIAGNOSIS — I10 Essential (primary) hypertension: Secondary | ICD-10-CM | POA: Diagnosis not present

## 2015-08-25 DIAGNOSIS — E785 Hyperlipidemia, unspecified: Secondary | ICD-10-CM

## 2015-08-25 MED ORDER — CEPHALEXIN 500 MG PO CAPS: 500.0000 mg | ORAL_CAPSULE | Freq: Two times a day (BID) | ORAL | Status: DC

## 2015-08-25 NOTE — Progress Notes (Signed)
Patient ID: Julie Greene, female   DOB: 05-May-1956, 60 y.o.   MRN: [id number]   Subjective:    Patient ID: Julie Greene, female    DOB: [birth year], 60 y.o.   MRN: [id number]  Patient presents for Medication Review/ Refill  Pt here to f/u medications, last seen Feb 2016  Taking lotensin norvasc for HTN Pravastatin for hyperlipidemia.  States that she feels well. Her only concern is that she has been getting recurrent boils on her buttocks and sometimes near the vaginal area. She had one that came up a few days ago she used a potato peeling to help draw out some of the infection. She no longer has any pain at the area.     Review Of Systems:  GEN- denies fatigue, fever, weight loss,weakness, recent illness HEENT- denies eye drainage, change in vision, nasal discharge, CVS- denies chest pain, palpitations RESP- denies SOB, cough, wheeze ABD- denies N/V, change in stools, abd pain GU- denies dysuria, hematuria, dribbling, incontinence MSK- denies joint pain, muscle aches, injury Neuro- denies headache, dizziness, syncope, seizure activity       Objective:    BP 132/68 mmHg  Pulse 70  Temp(Src) 98.8 F (37.1 C) (Oral)  Resp 14  Ht  (1.6 m)  Wt 170 lb (77.111 kg)  BMI 30.12 kg/m2 GEN- NAD, alert and oriented x3 HEENT- PERRL, EOMI, non injected sclera, pink conjunctiva, MMM, oropharynx clear Neck- Supple, no thyromegaly CVS- RRR, no murmur RESP-CTAB Skin- right gluteal cleft- small opening, no drainage, no induration, +erythema, mild TTP EXT- No edema Pulses- Radial  2+        Assessment & Plan:      Problem List Items Addressed This Visit    Impaired glucose tolerance   Relevant Orders   Hemoglobin A1c   Hyperlipidemia   Relevant Orders   Lipid panel   Essential hypertension - Primary    Controlled, no change to meds  Non fasting labs done today  Weight up 5lbs and history of because intolerance. Discussed dietary changes and weight loss that is  needed. She is now back to working out. We also discussed eating more fruits and vegetables less carbs and starches she was asking about some of the weight loss drugs over-the-counter advised her against these.      Relevant Orders   CBC with Differential/Platelet   Comprehensive metabolic panel    Other Visit Diagnoses    Need for hepatitis C screening test        Relevant Orders    Hepatitis C Antibody    Boil of buttock        pretty much resolved at this point, given keflex if this returns in same region    Relevant Medications    cephALEXin (KEFLEX) 500 MG capsule       Note: This dictation was prepared with Dragon dictation along with smaller phrase technology. Any transcriptional errors that result from this process are unintentional.

## 2015-08-25 NOTE — Patient Instructions (Addendum)
Continue current medications  We will call lab results  Start antibiotics if the boil gets worse   F/U 6 months Physical

## 2015-08-25 NOTE — Assessment & Plan Note (Signed)
Controlled, no change to meds  Non fasting labs done today  Weight up 5lbs and history of because intolerance. Discussed dietary changes and weight loss that is needed. She is now back to working out. We also discussed eating more fruits and vegetables less carbs and starches she was asking about some of the weight loss drugs over-the-counter advised her against these.

## 2015-08-26 LAB — COMPREHENSIVE METABOLIC PANEL
ALBUMIN: 4.4 g/dL (ref 3.6–5.1)
ALK PHOS: 80 U/L (ref 33–130)
ALT: 26 U/L (ref 6–29)
AST: 25 U/L (ref 10–35)
BILIRUBIN TOTAL: 0.4 mg/dL (ref 0.2–1.2)
BUN: 12 mg/dL (ref 7–25)
CO2: 36 mmol/L — ABNORMAL HIGH (ref 20–31)
Calcium: 9.2 mg/dL (ref 8.6–10.4)
Chloride: 101 mmol/L (ref 98–110)
Creat: 0.84 mg/dL (ref 0.50–0.99)
Glucose, Bld: 85 mg/dL (ref 70–99)
Potassium: 3.1 mmol/L — ABNORMAL LOW (ref 3.5–5.3)
Sodium: 141 mmol/L (ref 135–146)
Total Protein: 7.3 g/dL (ref 6.1–8.1)

## 2015-08-26 LAB — CBC WITH DIFFERENTIAL/PLATELET
Basophils Absolute: 0 10*3/uL (ref 0.0–0.1)
Basophils Relative: 0 % (ref 0–1)
Eosinophils Absolute: 0.2 10*3/uL (ref 0.0–0.7)
Eosinophils Relative: 3 % (ref 0–5)
HEMATOCRIT: 42 % (ref 36.0–46.0)
HEMOGLOBIN: 14.2 g/dL (ref 12.0–15.0)
LYMPHS ABS: 2.7 10*3/uL (ref 0.7–4.0)
LYMPHS PCT: 39 % (ref 12–46)
MCH: 30.3 pg (ref 26.0–34.0)
MCHC: 33.8 g/dL (ref 30.0–36.0)
MCV: 89.7 fL (ref 78.0–100.0)
MONO ABS: 0.6 10*3/uL (ref 0.1–1.0)
MPV: 9 fL (ref 8.6–12.4)
Monocytes Relative: 8 % (ref 3–12)
NEUTROS ABS: 3.5 10*3/uL (ref 1.7–7.7)
Neutrophils Relative %: 50 % (ref 43–77)
Platelets: 265 10*3/uL (ref 150–400)
RBC: 4.68 MIL/uL (ref 3.87–5.11)
RDW: 14.2 % (ref 11.5–15.5)
WBC: 6.9 10*3/uL (ref 4.0–10.5)

## 2015-08-26 LAB — HEMOGLOBIN A1C
Hgb A1c MFr Bld: 6 % — ABNORMAL HIGH (ref <5.7)
Mean Plasma Glucose: 126 mg/dL — ABNORMAL HIGH (ref <117)

## 2015-08-26 LAB — LIPID PANEL
Cholesterol: 144 mg/dL (ref 125–200)
HDL: 46 mg/dL (ref >=46)
LDL Cholesterol: 82 mg/dL (ref <130)
Total CHOL/HDL Ratio: 3.1 Ratio (ref <=5.0)
Triglycerides: 82 mg/dL (ref <150)
VLDL: 16 mg/dL (ref <30)

## 2015-08-26 LAB — HEPATITIS C ANTIBODY: HCV Ab: NEGATIVE

## 2015-08-29 ENCOUNTER — Other Ambulatory Visit: Payer: Self-pay | Admitting: *Deleted

## 2015-08-29 MED ORDER — POTASSIUM CHLORIDE CRYS ER 20 MEQ PO TBCR: 20.0000 meq | EXTENDED_RELEASE_TABLET | Freq: Every day | ORAL | Status: DC

## 2015-08-30 ENCOUNTER — Other Ambulatory Visit: Payer: Self-pay | Admitting: Family Medicine

## 2015-08-30 NOTE — Telephone Encounter (Signed)
Medication refilled per protocol. 

## 2015-10-02 ENCOUNTER — Telehealth: Payer: Self-pay | Admitting: Family Medicine

## 2015-10-02 MED ORDER — PRAVASTATIN SODIUM 20 MG PO TABS: 20.0000 mg | ORAL_TABLET | Freq: Every day | ORAL | Status: DC

## 2015-10-02 NOTE — Telephone Encounter (Signed)
Patient would like a prescription for her pravastatin (PRAVACHOL) 20 MG tablet she uses [facility]  CB# [phone]

## 2015-10-02 NOTE — Telephone Encounter (Signed)
Prescription sent to pharmacy.

## 2016-01-24 ENCOUNTER — Telehealth: Payer: Self-pay | Admitting: *Deleted

## 2016-01-24 NOTE — Telephone Encounter (Signed)
[facility] intermittent FMLA forms.   Call placed to patient for more information.   Reason FMLA requested: assist patient mother Aleatha Borer(Irene Frankowski) to MD office for visits  Verbalized that fee may be charged and is per provider prerogative.   Forms routed to provider.

## 2016-01-26 NOTE — Telephone Encounter (Signed)
Received completed FMLA forms from provider.   Simple Form Fee charge per provider. Patient contacted to pay fee.   LMTRC.

## 2016-02-01 NOTE — Telephone Encounter (Signed)
FMLA forms placed up front for patient to pay for prior to faxing.

## 2016-02-09 ENCOUNTER — Other Ambulatory Visit: Payer: Self-pay | Admitting: Family Medicine

## 2016-02-09 DIAGNOSIS — Z1231 Encounter for screening mammogram for malignant neoplasm of breast: Secondary | ICD-10-CM

## 2016-02-23 ENCOUNTER — Ambulatory Visit: Payer: BLUE CROSS/BLUE SHIELD | Admitting: Family Medicine

## 2016-02-26 ENCOUNTER — Ambulatory Visit (INDEPENDENT_AMBULATORY_CARE_PROVIDER_SITE_OTHER): Payer: BLUE CROSS/BLUE SHIELD | Admitting: Family Medicine

## 2016-02-26 ENCOUNTER — Encounter: Payer: Self-pay | Admitting: Family Medicine

## 2016-02-26 VITALS — BP 130/74 | HR 72 | Temp 98.6°F | Resp 16 | Ht 63.0 in | Wt 165.0 lb

## 2016-02-26 DIAGNOSIS — I10 Essential (primary) hypertension: Secondary | ICD-10-CM

## 2016-02-26 DIAGNOSIS — E663 Overweight: Secondary | ICD-10-CM | POA: Diagnosis not present

## 2016-02-26 DIAGNOSIS — R7302 Impaired glucose tolerance (oral): Secondary | ICD-10-CM

## 2016-02-26 NOTE — Assessment & Plan Note (Signed)
BMI moved down to overweight continue monitoring carbs and sugar

## 2016-02-26 NOTE — Assessment & Plan Note (Signed)
Blood pressure is well-controlled no change to medication. She continues to work on dietary changes and weight loss. Recheck her renal function and potassium which was low at the last check as well as her A1c

## 2016-02-26 NOTE — Progress Notes (Signed)
    Subjective:    Patient ID: Julie Greene, female    DOB: 17-Dec-1955, 60 y.o.   MRN: [id number]  Patient presents for Follow-up (is not fasting) Patient to follow-up chronic medical problems. She is being followed for glucose intolerance as well as hypertension. Her weight is down 5 pounds since her last visit  Cholesterol was at goal however her A1c had increased in January to 6% No concerns today  Review Of Systems:  GEN- denies fatigue, fever, weight loss,weakness, recent illness HEENT- denies eye drainage, change in vision, nasal discharge, CVS- denies chest pain, palpitations RESP- denies SOB, cough, wheeze ABD- denies N/V, change in stools, abd pain GU- denies dysuria, hematuria, dribbling, incontinence MSK- denies joint pain, muscle aches, injury Neuro- denies headache, dizziness, syncope, seizure activity       Objective:    BP 130/74 (BP Location: Left Arm, Patient Position: Sitting, Cuff Size: Normal)   Pulse 72   Temp 98.6 F (37 C) (Oral)   Resp 16   Ht 5\' 3"  (1.6 m)   Wt 165 lb (74.8 kg)   BMI 29.23 kg/m  GEN- NAD, alert and oriented x3 HEENT- PERRL, EOMI, non injected sclera, pink conjunctiva, MMM, oropharynx clear CVS- RRR, no murmur RESP-CTAB EXT- No edema Pulses- Radial DP- 2+        Assessment & Plan:      Problem List Items Addressed This Visit    Overweight    BMI moved down to overweight continue monitoring carbs and sugar      Impaired glucose tolerance - Primary   Relevant Orders   Hemoglobin A1c   Essential hypertension    Blood pressure is well-controlled no change to medication. She continues to work on dietary changes and weight loss. Recheck her renal function and potassium which was low at the last check as well as her A1c      Relevant Orders   Basic metabolic panel    Other Visit Diagnoses   None.     Note: This dictation was prepared with Dragon dictation along with smaller phrase technology. Any transcriptional  errors that result from this process are unintentional.

## 2016-02-26 NOTE — Patient Instructions (Signed)
F/U 6 months Physical  

## 2016-02-27 LAB — BASIC METABOLIC PANEL
BUN: 12 mg/dL (ref 7–25)
CHLORIDE: 101 mmol/L (ref 98–110)
CO2: 31 mmol/L (ref 20–31)
Calcium: 9.2 mg/dL (ref 8.6–10.4)
Creat: 0.87 mg/dL (ref 0.50–0.99)
Glucose, Bld: 92 mg/dL (ref 70–99)
POTASSIUM: 3.2 mmol/L — AB (ref 3.5–5.3)
Sodium: 140 mmol/L (ref 135–146)

## 2016-02-27 LAB — HEMOGLOBIN A1C
Hgb A1c MFr Bld: 6.1 % — ABNORMAL HIGH (ref <5.7)
MEAN PLASMA GLUCOSE: 128 mg/dL

## 2016-02-29 ENCOUNTER — Ambulatory Visit
Admission: RE | Admit: 2016-02-29 | Discharge: 2016-02-29 | Disposition: A | Discharge status: Home / Self Care | Payer: BLUE CROSS/BLUE SHIELD | Source: Ambulatory Visit | Attending: Family Medicine | Admitting: Family Medicine

## 2016-02-29 ENCOUNTER — Telehealth: Payer: Self-pay | Admitting: Family Medicine

## 2016-02-29 DIAGNOSIS — Z1231 Encounter for screening mammogram for malignant neoplasm of breast: Secondary | ICD-10-CM

## 2016-02-29 MED ORDER — AMLODIPINE BESYLATE 5 MG PO TABS: 5.0000 mg | ORAL_TABLET | Freq: Every day | ORAL | 3 refills | Status: DC

## 2016-02-29 MED ORDER — BENAZEPRIL-HYDROCHLOROTHIAZIDE 20-12.5 MG PO TABS: 1.0000 | ORAL_TABLET | Freq: Every day | ORAL | 3 refills | Status: DC

## 2016-02-29 MED ORDER — PRAVASTATIN SODIUM 20 MG PO TABS: 20.0000 mg | ORAL_TABLET | Freq: Every day | ORAL | 3 refills | Status: DC

## 2016-02-29 NOTE — Telephone Encounter (Signed)
Prescription sent to pharmacy.

## 2016-02-29 NOTE — Telephone Encounter (Signed)
Patient was here Monday and forgot to ask for refills for her mail order  She needs amlodipine pravastatin and benazepril  Sent to [facility]  [phone]

## 2016-03-04 ENCOUNTER — Encounter: Payer: Self-pay | Admitting: *Deleted

## 2016-03-25 ENCOUNTER — Other Ambulatory Visit: Payer: Self-pay | Admitting: *Deleted

## 2016-03-25 MED ORDER — POTASSIUM CHLORIDE ER 10 MEQ PO TBCR: 10.0000 meq | EXTENDED_RELEASE_TABLET | Freq: Every day | ORAL | 3 refills | Status: DC

## 2016-03-25 NOTE — Telephone Encounter (Signed)
Received call from patient.   Requested refill on K+ to mail order.   Prescription sent to pharmacy.

## 2016-07-31 ENCOUNTER — Other Ambulatory Visit: Payer: Self-pay | Admitting: Family Medicine

## 2016-10-18 ENCOUNTER — Other Ambulatory Visit: Payer: BLUE CROSS/BLUE SHIELD

## 2016-10-18 ENCOUNTER — Telehealth: Payer: Self-pay | Admitting: Family Medicine

## 2016-10-18 ENCOUNTER — Other Ambulatory Visit: Payer: Self-pay | Admitting: Family Medicine

## 2016-10-18 DIAGNOSIS — Z79899 Other long term (current) drug therapy: Secondary | ICD-10-CM

## 2016-10-18 DIAGNOSIS — E785 Hyperlipidemia, unspecified: Secondary | ICD-10-CM

## 2016-10-18 DIAGNOSIS — I10 Essential (primary) hypertension: Secondary | ICD-10-CM

## 2016-10-18 DIAGNOSIS — F419 Anxiety disorder, unspecified: Secondary | ICD-10-CM

## 2016-10-18 DIAGNOSIS — F32A Depression, unspecified: Secondary | ICD-10-CM

## 2016-10-18 DIAGNOSIS — F329 Major depressive disorder, single episode, unspecified: Secondary | ICD-10-CM

## 2016-10-18 DIAGNOSIS — R7302 Impaired glucose tolerance (oral): Secondary | ICD-10-CM

## 2016-10-18 DIAGNOSIS — E559 Vitamin D deficiency, unspecified: Secondary | ICD-10-CM

## 2016-10-18 LAB — CBC WITH DIFFERENTIAL/PLATELET
Basophils Absolute: 59 cells/uL (ref 0–200)
Basophils Relative: 1 %
Eosinophils Absolute: 118 cells/uL (ref 15–500)
Eosinophils Relative: 2 %
HCT: 42.8 % (ref 35.0–45.0)
Hemoglobin: 13.9 g/dL (ref 12.0–15.0)
Lymphocytes Relative: 43 %
Lymphs Abs: 2537 cells/uL (ref 850–3900)
MCH: 29.1 pg (ref 27.0–33.0)
MCHC: 32.5 g/dL (ref 32.0–36.0)
MCV: 89.7 fL (ref 80.0–100.0)
MPV: 8.8 fL (ref 7.5–12.5)
Monocytes Absolute: 354 cells/uL (ref 200–950)
Monocytes Relative: 6 %
Neutro Abs: 2832 cells/uL (ref 1500–7800)
Neutrophils Relative %: 48 %
Platelets: 269 10*3/uL (ref 140–400)
RBC: 4.77 MIL/uL (ref 3.80–5.10)
RDW: 14.5 % (ref 11.0–15.0)
WBC: 5.9 10*3/uL (ref 3.8–10.8)

## 2016-10-18 LAB — COMPLETE METABOLIC PANEL WITH GFR
ALT: 16 U/L (ref 6–29)
AST: 19 U/L (ref 10–35)
Albumin: 4.3 g/dL (ref 3.6–5.1)
Alkaline Phosphatase: 75 U/L (ref 33–130)
BILIRUBIN TOTAL: 0.5 mg/dL (ref 0.2–1.2)
BUN: 15 mg/dL (ref 7–25)
CO2: 29 mmol/L (ref 20–31)
Calcium: 9 mg/dL (ref 8.6–10.4)
Chloride: 103 mmol/L (ref 98–110)
Creat: 0.81 mg/dL (ref 0.50–0.99)
GFR, EST NON AFRICAN AMERICAN: 79 mL/min (ref >=60)
GFR, Est African American: >89 mL/min (ref >=60)
GLUCOSE: 91 mg/dL (ref 70–99)
Potassium: 3.5 mmol/L (ref 3.5–5.3)
SODIUM: 142 mmol/L (ref 135–146)
TOTAL PROTEIN: 7.3 g/dL (ref 6.1–8.1)

## 2016-10-18 LAB — TSH: TSH: 0.36 mIU/L — ABNORMAL LOW

## 2016-10-18 LAB — LIPID PANEL
Cholesterol: 139 mg/dL (ref <200)
HDL: 46 mg/dL — AB (ref >50)
LDL CALC: 76 mg/dL (ref <100)
TRIGLYCERIDES: 87 mg/dL (ref <150)
Total CHOL/HDL Ratio: 3 Ratio (ref <5.0)
VLDL: 17 mg/dL (ref <30)

## 2016-10-18 NOTE — Telephone Encounter (Signed)
Patient would like to come in for blood work today, however would like to speak to you regarding this before she comes in [phone]

## 2016-10-18 NOTE — Telephone Encounter (Signed)
Pt coming for fasting labs for visit next week, orders placed

## 2016-10-19 LAB — VITAMIN D 25 HYDROXY (VIT D DEFICIENCY, FRACTURES): VIT D 25 HYDROXY: 22 ng/mL — AB (ref 30–100)

## 2016-10-19 LAB — HEMOGLOBIN A1C
HEMOGLOBIN A1C: 5.7 % — AB (ref <5.7)
MEAN PLASMA GLUCOSE: 117 mg/dL

## 2016-10-21 ENCOUNTER — Encounter: Payer: Self-pay | Admitting: Family Medicine

## 2016-10-21 ENCOUNTER — Ambulatory Visit (INDEPENDENT_AMBULATORY_CARE_PROVIDER_SITE_OTHER): Payer: BLUE CROSS/BLUE SHIELD | Admitting: Family Medicine

## 2016-10-21 VITALS — BP 126/70 | HR 82 | Temp 98.9°F | Resp 16 | Ht 63.0 in | Wt 171.0 lb

## 2016-10-21 DIAGNOSIS — E559 Vitamin D deficiency, unspecified: Secondary | ICD-10-CM | POA: Diagnosis not present

## 2016-10-21 DIAGNOSIS — Z0000 Encounter for general adult medical examination without abnormal findings: Secondary | ICD-10-CM | POA: Diagnosis not present

## 2016-10-21 DIAGNOSIS — Z6830 Body mass index (BMI) 30.0-30.9, adult: Secondary | ICD-10-CM

## 2016-10-21 DIAGNOSIS — E6609 Other obesity due to excess calories: Secondary | ICD-10-CM | POA: Diagnosis not present

## 2016-10-21 DIAGNOSIS — R202 Paresthesia of skin: Secondary | ICD-10-CM

## 2016-10-21 DIAGNOSIS — R946 Abnormal results of thyroid function studies: Secondary | ICD-10-CM | POA: Diagnosis not present

## 2016-10-21 DIAGNOSIS — R7989 Other specified abnormal findings of blood chemistry: Secondary | ICD-10-CM | POA: Insufficient documentation

## 2016-10-21 DIAGNOSIS — I10 Essential (primary) hypertension: Secondary | ICD-10-CM | POA: Diagnosis not present

## 2016-10-21 DIAGNOSIS — E7800 Pure hypercholesterolemia, unspecified: Secondary | ICD-10-CM | POA: Diagnosis not present

## 2016-10-21 NOTE — Patient Instructions (Addendum)
F/U 6 months  Take Vitamin D 1000IU once  A day over the counter Work on diet and exercise  Dr. Izora Ribasoley - Plastic surgeon Dr. Stephens NovemberHolderness- Plastic Surgeon  We will call with lab results

## 2016-10-21 NOTE — Progress Notes (Signed)
   Subjective:    Patient ID: Julie Greene, female    DOB: 1956-03-08, 61 y.o.   MRN: [id number]  Patient presents for CPE (has had labs)  She here for complete physical exam. Medications reviewed. Fasting labs reviewed  Colonoscopy UTD Mammogram UTD- due in August  PAP Smear UTD   Immunizations UTD    Hypertension- blood pressure has been controlled, no side effects with any medications. Hyperlipidemia tolerating pravastatin without dificulty.  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, she uses bengay, and topical cream as needed   She is interested in tummy tuck, asked for recommendations   Weight up  Review Of Systems:  GEN- denies fatigue, fever, weight loss,weakness, recent illness HEENT- denies eye drainage, change in vision, nasal discharge, CVS- denies chest pain, palpitations RESP- denies SOB, cough, wheeze ABD- denies N/V, change in stools, abd pain GU- denies dysuria, hematuria, dribbling, incontinence MSK- denies joint pain, muscle aches, injury Neuro- denies headache, dizziness, syncope, seizure activity       Objective:    BP 126/70   Pulse 82   Temp 98.9 F (37.2 C) (Oral)   Resp 16   Ht 5\' 3"  (1.6 m)   Wt 171 lb (77.6 kg)   SpO2 98%   BMI 30.29 kg/m  GEN- NAD, alert and oriented x3 HEENT- PERRL, EOMI, non injected sclera, pink conjunctiva, MMM, oropharynx clear Neck- Supple, no thyromegaly CVS- RRR, no murmur RESP-CTAB ABD-NABS,soft,NT,ND EXT- No edema Pulses- Radial, DP- 2+        Assessment & Plan:      Problem List Items Addressed This Visit    Vitamin D deficiency    1000iu otc      Obese   Notalgia paresthetica   Hyperlipidemia    Cholesterol looks good no change to her statin drug. We discussed her diet which she is trying to maintain her that she has picked up some weight. She is no longer borderline diabetic.  I did  give her some recommendations for plastic surgeons that she can look into.      Essential hypertension    Blood pressure well controlled renal function also preserved no change to medication.      Abnormal TSH    Check T3/T4  Pt also requested HIV screen, this was added to labs       Other Visit Diagnoses    Routine general medical examination at a health care facility    -  Primary   CPE done prevention up-to-date immunizations up-to-date fasting labs      Note: This dictation was prepared with Dragon dictation along with smaller phrase technology. Any transcriptional errors that result from this process are unintentional.

## 2016-10-21 NOTE — Assessment & Plan Note (Signed)
1000iu otc

## 2016-10-21 NOTE — Assessment & Plan Note (Addendum)
Cholesterol looks good no change to her statin drug. We discussed her diet which she is trying to maintain her that she has picked up some weight. She is no longer borderline diabetic.  I did give her some recommendations for plastic surgeons that she can look into.

## 2016-10-21 NOTE — Assessment & Plan Note (Addendum)
Blood pressure well controlled renal function also preserved no change to medication.

## 2016-10-21 NOTE — Assessment & Plan Note (Signed)
Check T3/T4  Pt also requested HIV screen, this was added to labs

## 2016-10-22 ENCOUNTER — Telehealth: Payer: Self-pay | Admitting: Family Medicine

## 2016-10-22 LAB — T3, FREE: T3 FREE: 3.1 pg/mL (ref 2.3–4.2)

## 2016-10-22 LAB — HIV ANTIBODY (ROUTINE TESTING W REFLEX): HIV: NONREACTIVE

## 2016-10-22 LAB — T4: T4 TOTAL: 11.7 ug/dL (ref 4.5–12.0)

## 2016-10-22 MED ORDER — PRAVASTATIN SODIUM 20 MG PO TABS: 20.0000 mg | ORAL_TABLET | Freq: Every day | ORAL | 3 refills | Status: DC

## 2016-10-22 MED ORDER — AMLODIPINE BESYLATE 5 MG PO TABS: 5.0000 mg | ORAL_TABLET | Freq: Every day | ORAL | 3 refills | Status: DC

## 2016-10-22 MED ORDER — BENAZEPRIL-HYDROCHLOROTHIAZIDE 20-12.5 MG PO TABS: 1.0000 | ORAL_TABLET | Freq: Every day | ORAL | 3 refills | Status: DC

## 2016-10-22 NOTE — Telephone Encounter (Signed)
Patient states her medication needs to be called into [facility]  amLODipine (NORVASC) 5 MG benazepril-hydrochlorthiazide (LOTENSIN HCT) 20-12.5 MG  pravastatin (PRAVACHOL) 20 MG tablet [[id number]]  CB# [phone]

## 2016-10-22 NOTE — Telephone Encounter (Signed)
Prescription sent to pharmacy.

## 2016-10-23 ENCOUNTER — Telehealth: Payer: Self-pay | Admitting: Family Medicine

## 2016-10-23 ENCOUNTER — Encounter: Payer: Self-pay | Admitting: *Deleted

## 2016-10-23 NOTE — Telephone Encounter (Signed)
Patient aware per MyChart.   

## 2016-10-23 NOTE — Telephone Encounter (Signed)
The names are on the bottom of her discharge summary

## 2016-10-23 NOTE — Telephone Encounter (Signed)
MD please advise

## 2016-10-23 NOTE — Telephone Encounter (Signed)
Asking about list of Cosmetic doctors you discussed.  York SpanielSaid you can send to her in "my Chart"

## 2016-10-24 ENCOUNTER — Encounter: Payer: Self-pay | Admitting: *Deleted

## 2016-10-24 ENCOUNTER — Other Ambulatory Visit: Payer: Self-pay | Admitting: *Deleted

## 2016-10-24 DIAGNOSIS — R7989 Other specified abnormal findings of blood chemistry: Secondary | ICD-10-CM

## 2016-12-16 ENCOUNTER — Other Ambulatory Visit: Payer: Self-pay

## 2017-02-12 ENCOUNTER — Telehealth: Payer: Self-pay | Admitting: Family Medicine

## 2017-02-12 NOTE — Telephone Encounter (Signed)
Patient dropped off fmla forms will route these to christina

## 2017-02-13 NOTE — Telephone Encounter (Signed)
Noted  

## 2017-02-13 NOTE — Telephone Encounter (Signed)
Received fax from Lucy Crosslin at [employer] for intermittent FMLA forms.   Call placed to patient for more information.   Reason FMLA requested: assist patient mother (Irene Abaya) to MD office for visits  Verbalized that fee may be charged and is per provider prerogative.   Forms routed to provider.    

## 2017-02-13 NOTE — Telephone Encounter (Signed)
I can only put info for her father, since her mother in in SNF she does not come into this office any longer.

## 2017-02-14 NOTE — Telephone Encounter (Signed)
Received completed FMLA forms from provider.   Simple form fee charge per provider. Patient contacted to pay fee. Will call back to pay fee.

## 2017-03-03 ENCOUNTER — Encounter: Payer: Self-pay | Admitting: Family Medicine

## 2017-04-23 ENCOUNTER — Encounter: Payer: Self-pay | Admitting: Family Medicine

## 2017-04-23 ENCOUNTER — Ambulatory Visit (INDEPENDENT_AMBULATORY_CARE_PROVIDER_SITE_OTHER): Payer: BLUE CROSS/BLUE SHIELD | Admitting: Family Medicine

## 2017-04-23 VITALS — BP 122/68 | HR 82 | Temp 98.1°F | Resp 14 | Ht 63.0 in | Wt 171.0 lb

## 2017-04-23 DIAGNOSIS — E7800 Pure hypercholesterolemia, unspecified: Secondary | ICD-10-CM

## 2017-04-23 DIAGNOSIS — M67432 Ganglion, left wrist: Secondary | ICD-10-CM | POA: Diagnosis not present

## 2017-04-23 DIAGNOSIS — R946 Abnormal results of thyroid function studies: Secondary | ICD-10-CM

## 2017-04-23 DIAGNOSIS — Z6830 Body mass index (BMI) 30.0-30.9, adult: Secondary | ICD-10-CM

## 2017-04-23 DIAGNOSIS — E6609 Other obesity due to excess calories: Secondary | ICD-10-CM | POA: Diagnosis not present

## 2017-04-23 DIAGNOSIS — R7989 Other specified abnormal findings of blood chemistry: Secondary | ICD-10-CM

## 2017-04-23 DIAGNOSIS — M7712 Lateral epicondylitis, left elbow: Secondary | ICD-10-CM

## 2017-04-23 DIAGNOSIS — I10 Essential (primary) hypertension: Secondary | ICD-10-CM

## 2017-04-23 MED ORDER — DICLOFENAC SODIUM 75 MG PO TBEC: 75.0000 mg | DELAYED_RELEASE_TABLET | Freq: Two times a day (BID) | ORAL | 1 refills | Status: DC

## 2017-04-23 MED ORDER — AMLODIPINE BESYLATE 5 MG PO TABS: 5.0000 mg | ORAL_TABLET | Freq: Every day | ORAL | 3 refills | Status: DC

## 2017-04-23 MED ORDER — BENAZEPRIL-HYDROCHLOROTHIAZIDE 20-12.5 MG PO TABS: 1.0000 | ORAL_TABLET | Freq: Every day | ORAL | 3 refills | Status: DC

## 2017-04-23 MED ORDER — PRAVASTATIN SODIUM 20 MG PO TABS: 20.0000 mg | ORAL_TABLET | Freq: Every day | ORAL | 3 refills | Status: DC

## 2017-04-23 NOTE — Assessment & Plan Note (Signed)
At this point we are just on a monitor as it is very small and not causing any pain. With regards to the lateral epicondylitis she is can use an elbow sleeve ice as well as diclofenac twice a day if her pain is not improved after about 3 weeks we'll send her to orthopedics to get injection performed

## 2017-04-23 NOTE — Assessment & Plan Note (Signed)
Repeat today

## 2017-04-23 NOTE — Assessment & Plan Note (Signed)
Blood pressure controlled no changes 

## 2017-04-23 NOTE — Progress Notes (Signed)
Subjective:    Patient ID: Julie Greene, female    DOB: February 07, 1956, 61 y.o.   MRN: [id number]  Patient presents for Follow-up (is not fasting)  Patient here to follow-up chronic medical problems. She is on benazepril / HCTZ and amlodipine for her blood pressure she is tolerated without any difficulties. She is also on pravastatin for cholesterol. She had fasting labs that in March her lipid panel was normal She did have abnormal TSH which was low at 0.36 her T3 and T4 were normal she not return to have her repeat thyroid testing   He does note a small nodule in her left wrist does not cause any pain or impact her activities however she has had a swelling at her elbow on the lateral side for the past few weeks when she picks up things heavy she has pain when she bends a certain way. She did take a couple of doses of ibuprofen which helps some. No particular injury  Review Of Systems:  GEN- denies fatigue, fever, weight loss,weakness, recent illness HEENT- denies eye drainage, change in vision, nasal discharge, CVS- denies chest pain, palpitations RESP- denies SOB, cough, wheeze ABD- denies N/V, change in stools, abd pain GU- denies dysuria, hematuria, dribbling, incontinence MSK- + joint pain, muscle aches, injury Neuro- denies headache, dizziness, syncope, seizure activity       Objective:    BP 122/68   Pulse 82   Temp 98.1 F (36.7 C) (Oral)   Resp 14   Ht  (1.6 m)   Wt 171 lb (77.6 kg)   SpO2 97%   BMI 30.29 kg/m  GEN- NAD, alert and oriented x3 HEENT- PERRL, EOMI, non injected sclera, pink conjunctiva, MMM, oropharynx clear Neck- Supple, no thyromegaly CVS- RRR, no murmur RESP-CTAB EXT- No edema, left wrist tiny pea size ganglion cyst ,normal inspection of LUE, TTP over lateral olecranon, good ROM elbow no effusion, strength equal bilat UE , pain with resistance to protation Pulses- Radial  2+        Assessment & Plan:      Problem List Items  Addressed This Visit      Unprioritized   Obese   Hyperlipidemia    Lipids at goal continue statin drug. Continue to reread dietary changes and weight loss of at least 10-15 pounds      Relevant Medications   benazepril-hydrochlorthiazide (LOTENSIN HCT) 20-12.5 MG tablet   pravastatin (PRAVACHOL) 20 MG tablet   amLODipine (NORVASC) 5 MG tablet   Ganglion cyst of wrist, left    At this point we are just on a monitor as it is very small and not causing any pain. With regards to the lateral epicondylitis she is can use an elbow sleeve ice as well as diclofenac twice a day if her pain is not improved after about 3 weeks we'll send her to orthopedics to get injection performed      Essential hypertension - Primary    Blood pressure controlled no changes      Relevant Medications   benazepril-hydrochlorthiazide (LOTENSIN HCT) 20-12.5 MG tablet   pravastatin (PRAVACHOL) 20 MG tablet   amLODipine (NORVASC) 5 MG tablet   Other Relevant Orders   Basic metabolic panel   Abnormal TSH    Repeat today      Relevant Orders   TSH    Other Visit Diagnoses    Lateral epicondylitis of left eJacqulynn [facility] Medications   diclofenac (VOLTAREN) 75  MG EC tablet      Note: This dictation was prepared with Dragon dictation along with smaller phrase technology. Any transcriptional errors that result from this process are unintentional.

## 2017-04-23 NOTE — Patient Instructions (Addendum)
Get elbow sleeve  Take anti inflammatory with food twice a day  Ice the elbow  We will call with lab results F/U 6 months for Physical  Tennis Elbow Tennis elbow (lateral epicondylitis) is inflammation of the outer tendons of your forearm close to your elbow. Your tendons attach your muscles to your bones. The outer tendons of your forearm are used to extend your wrist, and they attach on the outside part of your elbow. Tennis elbow is often found in people who play tennis, but anyone may get the condition from repeatedly extending the wrist or turning the forearm. What are the causes? This condition is caused by repeatedly extending your wrist and using your hands. It can result from sports or work that requires repetitive forearm movements. Tennis elbow may also be caused by an injury. What increases the risk? You have a higher risk of developing tennis elbow if you play tennis or another racquet sport. You also have a higher risk if you frequently use your hands for work. This condition is also more likely to develop in:  Musicians.  Carpenters, painters, and plumbers.  Cooks.  Cashiers.  People who work in [employer].  Holiday representative workers.  Butchers.  People who use computers.  What are the signs or symptoms? Symptoms of this condition include:  Pain and tenderness in your forearm and the outer part of your elbow. You may only feel the pain when you use your arm, or you may feel it even when you are not using your arm.  A burning feeling that runs from your elbow through your arm.  Weak grip in your hands.  How is this diagnosed? This condition may be diagnosed by medical history and physical exam. You may also have other tests, including:  X-rays.  MRI.  How is this treated? Your health care provider will recommend lifestyle adjustments, such as resting and icing your arm. Treatment may also include:  Medicines for inflammation. This may include shots of cortisone  if your pain continues.  Physical therapy. This may include massage or exercises.  An elbow brace.  Surgery may eventually be recommended if your pain does not go away with treatment. Follow these instructions at home: Activity  Rest your elbow and wrist as directed by your health care provider. Try to avoid any activities that caused the problem until your health care provider says that you can do them again.  If a physical therapist teaches you exercises, do all of them as directed.  If you lift an object, lift it with your palm facing upward. This lowers the stress on your elbow. Lifestyle  If your tennis elbow is caused by sports, check your equipment and make sure that: ? You are using it correctly. ? It is the best fit for you.  If your tennis elbow is caused by work, take breaks frequently, if you are able. Talk with your manager about how to best perform tasks in a way that is safe. ? If your tennis elbow is caused by computer use, talk with your manager about any changes that can be made to your work environment. General instructions  If directed, apply ice to the painful area: ? Put ice in a plastic bag. ? Place a towel between your skin and the bag. ? Leave the ice on for 20 minutes, 2-3 times per day.  Take medicines only as directed by your health care provider.  If you were given a brace, wear it as directed by your  health care provider.  Keep all follow-up visits as directed by your health care provider. This is important. Contact a health care provider if:  Your pain does not get better with treatment.  Your pain gets worse.  You have numbness or weakness in your forearm, hand, or fingers. This information is not intended to replace advice given to you by your health care provider. Make sure you discuss any questions you have with your health care provider. Document Released: 07/15/2005 Document Revised: 03/14/2016 Document Reviewed: 07/11/2014 Elsevier  Interactive Patient Education  [employer].

## 2017-04-23 NOTE — Assessment & Plan Note (Signed)
Lipids at goal continue statin drug. Continue to reread dietary changes and weight loss of at least 10-15 pounds

## 2017-04-24 LAB — BASIC METABOLIC PANEL
BUN: 10 mg/dL (ref 7–25)
CHLORIDE: 99 mmol/L (ref 98–110)
CO2: 28 mmol/L (ref 20–32)
Calcium: 9.1 mg/dL (ref 8.6–10.4)
Creat: 0.82 mg/dL (ref 0.50–0.99)
Glucose, Bld: 91 mg/dL (ref 65–99)
POTASSIUM: 3.2 mmol/L — AB (ref 3.5–5.3)
Sodium: 140 mmol/L (ref 135–146)

## 2017-04-24 LAB — TSH: TSH: 0.6 mIU/L (ref 0.40–4.50)

## 2017-04-29 ENCOUNTER — Other Ambulatory Visit: Payer: Self-pay | Admitting: *Deleted

## 2017-04-29 MED ORDER — POTASSIUM CHLORIDE ER 10 MEQ PO TBCR: 10.0000 meq | EXTENDED_RELEASE_TABLET | Freq: Every day | ORAL | 3 refills | Status: DC

## 2017-05-05 ENCOUNTER — Other Ambulatory Visit: Payer: BLUE CROSS/BLUE SHIELD

## 2017-05-05 DIAGNOSIS — Z79899 Other long term (current) drug therapy: Secondary | ICD-10-CM

## 2017-05-05 DIAGNOSIS — E785 Hyperlipidemia, unspecified: Secondary | ICD-10-CM

## 2017-05-05 LAB — LIPID PANEL
CHOL/HDL RATIO: 3.5 (calc) (ref <5.0)
Cholesterol: 181 mg/dL (ref <200)
HDL: 52 mg/dL (ref >50)
LDL Cholesterol (Calc): 113 mg/dL (calc) — ABNORMAL HIGH
NON-HDL CHOLESTEROL (CALC): 129 mg/dL (ref <130)
Triglycerides: 70 mg/dL (ref <150)

## 2017-05-07 ENCOUNTER — Encounter: Payer: Self-pay | Admitting: Family Medicine

## 2017-05-16 ENCOUNTER — Telehealth: Payer: Self-pay | Admitting: Family Medicine

## 2017-05-16 MED ORDER — BENAZEPRIL-HYDROCHLOROTHIAZIDE 20-12.5 MG PO TABS: 1.0000 | ORAL_TABLET | Freq: Every day | ORAL | 3 refills | Status: DC

## 2017-05-16 MED ORDER — PRAVASTATIN SODIUM 20 MG PO TABS: 20.0000 mg | ORAL_TABLET | Freq: Every day | ORAL | 3 refills | Status: DC

## 2017-05-16 MED ORDER — AMLODIPINE BESYLATE 5 MG PO TABS: 5.0000 mg | ORAL_TABLET | Freq: Every day | ORAL | 3 refills | Status: DC

## 2017-05-16 NOTE — Telephone Encounter (Signed)
[facility]

## 2017-05-16 NOTE — Telephone Encounter (Signed)
Patient is calling to say that her benazepril, pravastatin, and amlodipine needs to be sent to [facility] instead of where it was sent

## 2017-05-25 ENCOUNTER — Encounter: Payer: Self-pay | Admitting: Family Medicine

## 2017-06-17 ENCOUNTER — Other Ambulatory Visit: Payer: Self-pay | Admitting: *Deleted

## 2017-06-17 MED ORDER — DICLOFENAC SODIUM 75 MG PO TBEC: 75.0000 mg | DELAYED_RELEASE_TABLET | Freq: Two times a day (BID) | ORAL | 1 refills | Status: DC

## 2017-08-25 ENCOUNTER — Telehealth: Payer: Self-pay | Admitting: *Deleted

## 2017-08-25 NOTE — Telephone Encounter (Signed)
[facility] [phone]~ telephone/ [phone]~ fax for intermittent FMLA forms.   Call placed to patient for more information.   Reason FMLA requested: assist patient father Marianna Fuss(Fred Hinnenkamp) to [place] office for visits  Verbalized that fee may be charged and is per provider prerogative.   Forms routed to provider.

## 2017-08-28 ENCOUNTER — Other Ambulatory Visit: Payer: Self-pay | Admitting: *Deleted

## 2017-08-28 MED ORDER — POTASSIUM CHLORIDE ER 10 MEQ PO TBCR: 10.0000 meq | EXTENDED_RELEASE_TABLET | Freq: Every day | ORAL | 3 refills | Status: DC

## 2017-08-28 NOTE — Telephone Encounter (Signed)
Received completed forms from provider.   No charge per provider.   Faxed to [place].

## 2017-08-31 ENCOUNTER — Other Ambulatory Visit: Payer: Self-pay | Admitting: Family Medicine

## 2017-10-16 ENCOUNTER — Ambulatory Visit: Payer: BLUE CROSS/BLUE SHIELD | Admitting: Physician Assistant

## 2017-11-27 ENCOUNTER — Other Ambulatory Visit: Payer: Self-pay | Admitting: Family Medicine

## 2017-12-04 ENCOUNTER — Other Ambulatory Visit: Payer: BLUE CROSS/BLUE SHIELD

## 2017-12-04 DIAGNOSIS — I10 Essential (primary) hypertension: Secondary | ICD-10-CM

## 2017-12-04 DIAGNOSIS — R7302 Impaired glucose tolerance (oral): Secondary | ICD-10-CM

## 2017-12-04 DIAGNOSIS — Z0000 Encounter for general adult medical examination without abnormal findings: Secondary | ICD-10-CM

## 2017-12-04 DIAGNOSIS — R7989 Other specified abnormal findings of blood chemistry: Secondary | ICD-10-CM

## 2017-12-04 DIAGNOSIS — E559 Vitamin D deficiency, unspecified: Secondary | ICD-10-CM

## 2017-12-04 DIAGNOSIS — E7800 Pure hypercholesterolemia, unspecified: Secondary | ICD-10-CM

## 2017-12-05 LAB — MICROALBUMIN / CREATININE URINE RATIO
CREATININE, URINE: 44 mg/dL (ref 20–275)
MICROALB/CREAT RATIO: 9 ug/mg{creat} (ref <30)
Microalb, Ur: 0.4 mg/dL

## 2017-12-05 LAB — CBC WITH DIFFERENTIAL/PLATELET
BASOS ABS: 42 {cells}/uL (ref 0–200)
Basophils Relative: 0.8 %
EOS ABS: 148 {cells}/uL (ref 15–500)
EOS PCT: 2.8 %
HEMATOCRIT: 44 % (ref 35.0–45.0)
HEMOGLOBIN: 14.7 g/dL (ref 11.7–15.5)
LYMPHS ABS: 1611 {cells}/uL (ref 850–3900)
MCH: 29.6 pg (ref 27.0–33.0)
MCHC: 33.4 g/dL (ref 32.0–36.0)
MCV: 88.5 fL (ref 80.0–100.0)
MPV: 9.4 fL (ref 7.5–12.5)
Monocytes Relative: 7.2 %
NEUTROS ABS: 3116 {cells}/uL (ref 1500–7800)
NEUTROS PCT: 58.8 %
Platelets: 293 10*3/uL (ref 140–400)
RBC: 4.97 10*6/uL (ref 3.80–5.10)
RDW: 13.2 % (ref 11.0–15.0)
Total Lymphocyte: 30.4 %
WBC: 5.3 10*3/uL (ref 3.8–10.8)
WBCMIX: 382 {cells}/uL (ref 200–950)

## 2017-12-05 LAB — COMPLETE METABOLIC PANEL WITH GFR
AG RATIO: 1.4 (calc) (ref 1.0–2.5)
ALBUMIN MSPROF: 4.4 g/dL (ref 3.6–5.1)
ALKALINE PHOSPHATASE (APISO): 87 U/L (ref 33–130)
ALT: 17 U/L (ref 6–29)
AST: 18 U/L (ref 10–35)
BUN: 16 mg/dL (ref 7–25)
CO2: 30 mmol/L (ref 20–32)
CREATININE: 0.78 mg/dL (ref 0.50–0.99)
Calcium: 9.4 mg/dL (ref 8.6–10.4)
Chloride: 102 mmol/L (ref 98–110)
GFR, EST NON AFRICAN AMERICAN: 81 mL/min/{1.73_m2} (ref >=60)
GFR, Est African American: 94 mL/min/{1.73_m2} (ref >=60)
GLOBULIN: 3.2 g/dL (ref 1.9–3.7)
Glucose, Bld: 108 mg/dL — ABNORMAL HIGH (ref 65–99)
POTASSIUM: 3.8 mmol/L (ref 3.5–5.3)
SODIUM: 142 mmol/L (ref 135–146)
Total Bilirubin: 0.4 mg/dL (ref 0.2–1.2)
Total Protein: 7.6 g/dL (ref 6.1–8.1)

## 2017-12-05 LAB — HEMOGLOBIN A1C
EAG (MMOL/L): 7 (calc)
Hgb A1c MFr Bld: 6 % of total Hgb — ABNORMAL HIGH (ref <5.7)
Mean Plasma Glucose: 126 (calc)

## 2017-12-05 LAB — LIPID PANEL
Cholesterol: 170 mg/dL (ref <200)
HDL: 52 mg/dL (ref >50)
LDL Cholesterol (Calc): 102 mg/dL (calc) — ABNORMAL HIGH
Non-HDL Cholesterol (Calc): 118 mg/dL (calc) (ref <130)
Total CHOL/HDL Ratio: 3.3 (calc) (ref <5.0)
Triglycerides: 70 mg/dL (ref <150)

## 2017-12-05 LAB — TSH: TSH: 0.4 mIU/L (ref 0.40–4.50)

## 2017-12-05 LAB — VITAMIN D 25 HYDROXY (VIT D DEFICIENCY, FRACTURES): VIT D 25 HYDROXY: 18 ng/mL — AB (ref 30–100)

## 2017-12-08 ENCOUNTER — Encounter: Payer: Self-pay | Admitting: Family Medicine

## 2017-12-08 ENCOUNTER — Other Ambulatory Visit: Payer: Self-pay

## 2017-12-08 ENCOUNTER — Ambulatory Visit (INDEPENDENT_AMBULATORY_CARE_PROVIDER_SITE_OTHER): Payer: BLUE CROSS/BLUE SHIELD | Admitting: Family Medicine

## 2017-12-08 VITALS — BP 128/62 | HR 84 | Temp 97.9°F | Resp 16 | Ht 63.0 in | Wt 175.0 lb

## 2017-12-08 DIAGNOSIS — R319 Hematuria, unspecified: Secondary | ICD-10-CM

## 2017-12-08 DIAGNOSIS — E6609 Other obesity due to excess calories: Secondary | ICD-10-CM

## 2017-12-08 DIAGNOSIS — E7800 Pure hypercholesterolemia, unspecified: Secondary | ICD-10-CM | POA: Diagnosis not present

## 2017-12-08 DIAGNOSIS — R7303 Prediabetes: Secondary | ICD-10-CM

## 2017-12-08 DIAGNOSIS — Z0000 Encounter for general adult medical examination without abnormal findings: Secondary | ICD-10-CM | POA: Diagnosis not present

## 2017-12-08 DIAGNOSIS — E66811 Obesity, class 1: Secondary | ICD-10-CM

## 2017-12-08 DIAGNOSIS — I10 Essential (primary) hypertension: Secondary | ICD-10-CM | POA: Diagnosis not present

## 2017-12-08 DIAGNOSIS — Z6831 Body mass index (BMI) 31.0-31.9, adult: Secondary | ICD-10-CM

## 2017-12-08 DIAGNOSIS — E559 Vitamin D deficiency, unspecified: Secondary | ICD-10-CM

## 2017-12-08 LAB — URINALYSIS, ROUTINE W REFLEX MICROSCOPIC
Bilirubin Urine: NEGATIVE
GLUCOSE, UA: NEGATIVE
HYALINE CAST: NONE SEEN /LPF
Ketones, ur: NEGATIVE
Leukocytes, UA: NEGATIVE
Nitrite: NEGATIVE
PROTEIN: NEGATIVE
Specific Gravity, Urine: 1.02 (ref 1.001–1.03)
pH: 6.5 (ref 5.0–8.0)

## 2017-12-08 LAB — MICROSCOPIC MESSAGE

## 2017-12-08 MED ORDER — VITAMIN D (ERGOCALCIFEROL) 1.25 MG (50000 UNIT) PO CAPS: 50000.0000 [IU] | ORAL_CAPSULE | ORAL | 0 refills | Status: DC

## 2017-12-08 NOTE — Assessment & Plan Note (Signed)
Controlled no changes 

## 2017-12-08 NOTE — Assessment & Plan Note (Signed)
Replace with ergocalciferal 50,000 x 12 weeks, then start 1000IU daily

## 2017-12-08 NOTE — Patient Instructions (Addendum)
Vitamin D once a week for 12 weeks, then take Vitamin D  1000IU once a day  Urology- referral for blood  F/u 6 months

## 2017-12-08 NOTE — Progress Notes (Signed)
   Subjective:    Patient ID: Julie Greene, female    DOB: 08-30-1955, 62 y.o.   MRN: [id number]  Patient presents for CPE (has had labs) Pt here for CPE  Fasting labs reviewed Medications and history reviewed   HTN- taking BP meds as prescribed Hyerplipidemia- taking pravastatin   Mammogram Due in August Colonoscopy UTD PAP Smear UTD - Dr. Cherly Hensen   Immunizations- UTD   Lbs reviewed at bedside Also seen by UC back in March, for low back pain, given naprosyn, took for a few days, but was also still on diclofenac. Told she had blood in urine was suppose to see urology Pain has resolved in back, no UTI symptoms   Review Of Systems:  GEN- denies fatigue, fever, weight loss,weakness, recent illness HEENT- denies eye drainage, change in vision, nasal discharge, CVS- denies chest pain, palpitations RESP- denies SOB, cough, wheeze ABD- denies N/V, change in stools, abd pain GU- denies dysuria, hematuria, dribbling, incontinence MSK- denies joint pain, muscle aches, injury Neuro- denies headache, dizziness, syncope, seizure activity       Objective:    BP 128/62   Pulse 84   Temp 97.9 F (36.6 C) (Oral)   Resp 16   Ht  (1.6 m)   Wt 175 lb (79.4 kg)   SpO2 98%   BMI 31.00 kg/m  GEN- NAD, alert and oriented x3 HEENT- PERRL, EOMI, non injected sclera, pink conjunctiva, MMM, oropharynx clear Neck- Supple, no thyromegaly CVS- RRR, no murmur RESP-CTAB ABD-NABS,soft,NT,ND, no CVA tenderness  EXT- No edema Pulses- Radial, DP- 2+        Assessment & Plan:      Problem List Items Addressed This Visit      Unprioritized   Obese   Vitamin D deficiency    Replace with ergocalciferal 50,000 x 12 weeks, then start 1000IU daily      Hyperlipidemia    Improved continue statin drug       Essential hypertension    Controlled no changes       Borderline diabetes    A1C elevated at 6%, discussed diet, cut out sweets       Other Visit Diagnoses    Routine general medical examination at a health care facility    -  Primary   CPE done, immunizations/prevention UTD   Hematuria, unspecified type       Referral to urology , blood x 2 months, asymptomatic    Relevant Orders   Urinalysis, Routine w reflex microscopic (Completed)   Ambulatory referral to Urology      Note: This dictation was prepared with Dragon dictation along with smaller phrase technology. Any transcriptional errors that result from this process are unintentional.

## 2017-12-08 NOTE — Assessment & Plan Note (Signed)
Improved continue statin drug

## 2017-12-08 NOTE — Assessment & Plan Note (Signed)
A1C elevated at 6%, discussed diet, cut out sweets

## 2018-03-03 ENCOUNTER — Other Ambulatory Visit: Payer: Self-pay | Admitting: Family Medicine

## 2018-04-19 ENCOUNTER — Encounter: Payer: Self-pay | Admitting: Family Medicine

## 2018-04-25 LAB — GLUCOSE, POCT (MANUAL RESULT ENTRY): POC Glucose: 119 mg/dl — AB (ref 70–99)

## 2018-05-14 ENCOUNTER — Telehealth: Payer: Self-pay | Admitting: Family Medicine

## 2018-05-14 NOTE — Telephone Encounter (Signed)
Patient brought froms in for herself, regarding her father fred Schiffer  Will route to [place]

## 2018-05-15 NOTE — Telephone Encounter (Signed)
Received forms for intermittent FMLA from patient.   Reason FMLA requested: assist patient father Johannah Rozas) to [place] office for visits, 24 hour care required D/T dementia- family has rotating care schedule, assistance with medications, meals, ADL's  Verbalized that fee may be charged and is per provider prerogative.   Forms routed to provider.  Lucy Crosslin at [employer] [phone]~ telephone/ [phone]~ fax

## 2018-05-20 ENCOUNTER — Other Ambulatory Visit: Payer: Self-pay | Admitting: Family Medicine

## 2018-05-26 ENCOUNTER — Other Ambulatory Visit: Payer: Self-pay | Admitting: Family Medicine

## 2018-06-10 ENCOUNTER — Telehealth: Payer: Self-pay | Admitting: *Deleted

## 2018-06-10 NOTE — Telephone Encounter (Signed)
Received fax requesting alternative to Benazepril HCTZ.   Reports that patient has $14 co-pay for Benazepril HCTZ, but would have $0 co-pay for LIsinopril HCTZ. Patient is requesting change in therapy.   Please advise.

## 2018-06-11 MED ORDER — LISINOPRIL-HYDROCHLOROTHIAZIDE 20-12.5 MG PO TABS: 1.0000 | ORAL_TABLET | Freq: Every day | ORAL | 3 refills | Status: DC

## 2018-06-11 NOTE — Telephone Encounter (Signed)
Ok to switch to lisinopril hct 20/12.5 poqday.

## 2018-06-11 NOTE — Telephone Encounter (Signed)
Prescription sent to pharmacy.

## 2018-06-12 ENCOUNTER — Other Ambulatory Visit: Payer: Self-pay | Admitting: Family Medicine

## 2018-06-26 ENCOUNTER — Other Ambulatory Visit: Payer: Self-pay | Admitting: Family Medicine

## 2018-06-28 ENCOUNTER — Encounter: Payer: Self-pay | Admitting: Family Medicine

## 2018-07-21 ENCOUNTER — Other Ambulatory Visit: Payer: Self-pay | Admitting: Family Medicine

## 2018-07-26 ENCOUNTER — Other Ambulatory Visit: Payer: Self-pay | Admitting: Family Medicine

## 2018-08-21 ENCOUNTER — Other Ambulatory Visit: Payer: Self-pay | Admitting: Family Medicine

## 2018-09-14 LAB — HM PAP SMEAR

## 2018-09-14 LAB — HM MAMMOGRAPHY

## 2018-10-05 ENCOUNTER — Other Ambulatory Visit: Payer: Self-pay | Admitting: Family Medicine

## 2018-10-18 ENCOUNTER — Emergency Department (HOSPITAL_COMMUNITY): Payer: BLUE CROSS/BLUE SHIELD

## 2018-10-18 ENCOUNTER — Encounter (HOSPITAL_COMMUNITY): Payer: Self-pay

## 2018-10-18 ENCOUNTER — Emergency Department (HOSPITAL_COMMUNITY)
Admission: EM | Admit: 2018-10-18 | Discharge: 2018-10-18 | Disposition: A | Discharge status: Home / Self Care | Payer: BLUE CROSS/BLUE SHIELD | Attending: Emergency Medicine | Admitting: Emergency Medicine

## 2018-10-18 ENCOUNTER — Other Ambulatory Visit: Payer: Self-pay

## 2018-10-18 DIAGNOSIS — E876 Hypokalemia: Secondary | ICD-10-CM | POA: Insufficient documentation

## 2018-10-18 DIAGNOSIS — I10 Essential (primary) hypertension: Secondary | ICD-10-CM | POA: Diagnosis not present

## 2018-10-18 DIAGNOSIS — R4182 Altered mental status, unspecified: Secondary | ICD-10-CM | POA: Insufficient documentation

## 2018-10-18 DIAGNOSIS — Z79899 Other long term (current) drug therapy: Secondary | ICD-10-CM | POA: Diagnosis not present

## 2018-10-18 DIAGNOSIS — Z7982 Long term (current) use of aspirin: Secondary | ICD-10-CM | POA: Diagnosis not present

## 2018-10-18 DIAGNOSIS — R509 Fever, unspecified: Secondary | ICD-10-CM | POA: Diagnosis present

## 2018-10-18 LAB — CBC WITH DIFFERENTIAL/PLATELET
Abs Immature Granulocytes: 0.01 10*3/uL (ref 0.00–0.07)
Basophils Absolute: 0 10*3/uL (ref 0.0–0.1)
Basophils Relative: 1 %
EOS PCT: 3 %
Eosinophils Absolute: 0.2 10*3/uL (ref 0.0–0.5)
HCT: 43.2 % (ref 36.0–46.0)
HEMOGLOBIN: 13.8 g/dL (ref 12.0–15.0)
Immature Granulocytes: 0 %
Lymphocytes Relative: 37 %
Lymphs Abs: 2.4 10*3/uL (ref 0.7–4.0)
MCH: 29.4 pg (ref 26.0–34.0)
MCHC: 31.9 g/dL (ref 30.0–36.0)
MCV: 91.9 fL (ref 80.0–100.0)
MONOS PCT: 7 %
Monocytes Absolute: 0.5 10*3/uL (ref 0.1–1.0)
Neutro Abs: 3.3 10*3/uL (ref 1.7–7.7)
Neutrophils Relative %: 52 %
Platelets: 256 10*3/uL (ref 150–400)
RBC: 4.7 MIL/uL (ref 3.87–5.11)
RDW: 13.8 % (ref 11.5–15.5)
WBC: 6.4 10*3/uL (ref 4.0–10.5)
nRBC: 0 % (ref 0.0–0.2)

## 2018-10-18 LAB — COMPREHENSIVE METABOLIC PANEL
ALT: 29 U/L (ref 0–44)
AST: 33 U/L (ref 15–41)
Albumin: 4.5 g/dL (ref 3.5–5.0)
Alkaline Phosphatase: 85 U/L (ref 38–126)
Anion gap: 9 (ref 5–15)
BUN: 15 mg/dL (ref 8–23)
CO2: 26 mmol/L (ref 22–32)
Calcium: 9.2 mg/dL (ref 8.9–10.3)
Chloride: 107 mmol/L (ref 98–111)
Creatinine, Ser: 0.88 mg/dL (ref 0.44–1.00)
GFR calc Af Amer: >60 mL/min (ref >60)
GFR calc non Af Amer: >60 mL/min (ref >60)
Glucose, Bld: 128 mg/dL — ABNORMAL HIGH (ref 70–99)
Potassium: 2.9 mmol/L — ABNORMAL LOW (ref 3.5–5.1)
Sodium: 142 mmol/L (ref 135–145)
Total Bilirubin: 0.4 mg/dL (ref 0.3–1.2)
Total Protein: 7.9 g/dL (ref 6.5–8.1)

## 2018-10-18 LAB — URINALYSIS, ROUTINE W REFLEX MICROSCOPIC
BACTERIA UA: NONE SEEN
Bilirubin Urine: NEGATIVE
Glucose, UA: NEGATIVE mg/dL
Ketones, ur: 5 mg/dL — AB
Leukocytes,Ua: NEGATIVE
Nitrite: NEGATIVE
Protein, ur: NEGATIVE mg/dL
Specific Gravity, Urine: 1.014 (ref 1.005–1.030)
pH: 8 (ref 5.0–8.0)

## 2018-10-18 LAB — MAGNESIUM: Magnesium: 2.1 mg/dL (ref 1.7–2.4)

## 2018-10-18 LAB — LACTIC ACID, PLASMA: LACTIC ACID, VENOUS: 1.4 mmol/L (ref 0.5–1.9)

## 2018-10-18 LAB — CBG MONITORING, ED: Glucose-Capillary: 98 mg/dL (ref 70–99)

## 2018-10-18 MED ORDER — LORAZEPAM 2 MG/ML IJ SOLN: 1.0000 mg | Freq: Once | INTRAMUSCULAR | Status: DC

## 2018-10-18 MED ORDER — POTASSIUM CHLORIDE CRYS ER 20 MEQ PO TBCR: 20.0000 meq | EXTENDED_RELEASE_TABLET | Freq: Every day | ORAL | 0 refills | Status: DC

## 2018-10-18 MED ORDER — POTASSIUM CHLORIDE CRYS ER 20 MEQ PO TBCR
40.0000 meq | EXTENDED_RELEASE_TABLET | Freq: Once | ORAL | Status: AC
  Administered 2018-10-18: 40 meq via ORAL
  Filled 2018-10-18: qty 2

## 2018-10-18 MED ORDER — SODIUM CHLORIDE 0.9 % IV BOLUS
1000.0000 mL | Freq: Once | INTRAVENOUS | Status: AC
  Administered 2018-10-18: 1000 mL via INTRAVENOUS

## 2018-10-18 MED ORDER — POTASSIUM CHLORIDE 10 MEQ/100ML IV SOLN
10.0000 meq | Freq: Once | INTRAVENOUS | Status: AC
  Administered 2018-10-18: 10 meq via INTRAVENOUS
  Filled 2018-10-18: qty 100

## 2018-10-18 NOTE — ED Provider Notes (Signed)
[facility] EMERGENCY DEPARTMENT Provider Note   CSN: [id number] Arrival date & time: 10/18/18  1420    History   Chief Complaint Chief Complaint  Patient presents with  . Fever    HPI Julie Greene is a 63 y.o. female.     Pt presents to the ED today with memory loss.  Pt lives with her sister and sister called EMS because she was repeating things and having trouble remembering things.  Pt denies any pain.  She did have a fever per EMS.  They gave her 1000 mg of tylenol en route.  She denies any sob or cough.  No dysuria.     Past Medical History:  Diagnosis Date  . COLONIC POLYPS, HX OF 09/22/2009  . GERD 09/22/2009  . HEMATURIA, MICROSCOPIC, HX OF 09/22/2009  . Hx of colonoscopy 08/29/2008  . HYPERLIPIDEMIA 09/22/2009  . HYPERTENSION 01/22/2007  . Impaired glucose tolerance 01/06/2011    Patient Active Problem List   Diagnosis Date Noted  . Ganglion cyst of wrist, left 04/23/2017  . 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 Vitamin D deficiency 10/21/2016  . Abnormal TSH 10/21/2016  . Obese 08/25/2015  . Acne 05/27/2014  . Edema 04/13/2014  . Anxiety and depression 01/08/2011  . Borderline diabetes 01/06/2011  . Hyperlipidemia 09/22/2009  . GERD 09/22/2009  . COLONIC POLYPS, HX OF 09/22/2009  . Essential hypertension 01/22/2007    Past Surgical History:  Procedure Laterality Date  . NO PAST SURGERIES       OB History    Gravida  1   Para      Term      Preterm      AB  1   Living        SAB      TAB      Ectopic      Multiple      Live Births               Home Medications    Prior to Admission medications   Medication Sig Start Date End Date Taking? Authorizing Provider  amLODipine (NORVASC) 5 MG tablet TAKE 1 TABLET BY MOUTH EVERY DAY 05/20/18   Salley Scarlet, MD  aspirin 81 MG tablet Take 81 mg by mouth daily.    [provider]  diclofenac (VOLTAREN) 75 MG EC tablet TAKE 1 TABLET BY MOUTH TWICE A  DAY 07/23/18   George, Velna Hatchet, MD  Echinacea 125 MG CAPS Take 1 capsule by mouth daily.    [provider]  lisinopril-hydrochlorothiazide (ZESTORETIC) 20-12.5 MG tablet Take 1 tablet by mouth daily. 06/11/18   Donita Brooks, MD  potassium chloride (K-DUR) 10 MEQ tablet TAKE 1 TABLET BY MOUTH EVERY DAY 10/05/18   Forsan, Velna Hatchet, MD  pravastatin (PRAVACHOL) 20 MG tablet Take 1 tablet (20 mg total) by mouth daily. 05/16/17   Salley Scarlet, MD  pravastatin (PRAVACHOL) 20 MG tablet TAKE 1 TABLET BY MOUTH EVERY DAY 08/21/18   Salley Scarlet, MD  Vitamin D, Ergocalciferol, (DRISDOL) 50000 units CAPS capsule Take 1 capsule (50,000 Units total) by mouth every 7 (seven) days. 12/08/17   Salley Scarlet, MD    Family History Family History  Problem Relation Age of Onset  . Diabetes Mother   . Hyperlipidemia Mother   . Hypertension Mother   . Heart disease Mother   . Diabetes Sister   . Thyroid disease Sister   . Cancer Paternal Grandmother   .  Hypertension Sister   . Hyperlipidemia Sister   . Hyperlipidemia Sister   . Hypertension Sister   . Diabetes Sister     Social History Social History   Tobacco Use  . Smoking status: Never Smoker  . Smokeless tobacco: Never Used  . Tobacco comment: Rare social in the past only  Substance Use Topics  . Alcohol use: Yes    Alcohol/week: 2.0 standard drinks    Types: 2 drink(s) per week    Comment: socially  . Drug use: No     Allergies   Sulfonamide derivatives   Review of Systems Review of Systems  Constitutional: Positive for fever.  Neurological:       Memory dificulty  All other systems reviewed and are negative.    Physical Exam Updated Vital Signs BP 134/75   Pulse 90   Temp 98.2 F (36.8 C) (Oral)   Resp 18   Ht 5\' 4"  (1.626 m)   Wt 69.9 kg   SpO2 100%   BMI 26.43 kg/m   Physical Exam Vitals signs and nursing note reviewed.  Constitutional:      Appearance: Normal appearance.  HENT:      Head: Normocephalic and atraumatic.     Right Ear: External ear normal.     Left Ear: External ear normal.     Nose: Nose normal.     Mouth/Throat:     Mouth: Mucous membranes are moist.  Eyes:     Extraocular Movements: Extraocular movements intact.     Conjunctiva/sclera: Conjunctivae normal.     Pupils: Pupils are equal, round, and reactive to light.  Neck:     Musculoskeletal: Normal range of motion and neck supple.  Cardiovascular:     Rate and Rhythm: Normal rate and regular rhythm.     Pulses: Normal pulses.     Heart sounds: Normal heart sounds.  Pulmonary:     Effort: Pulmonary effort is normal.     Breath sounds: Normal breath sounds.  Abdominal:     General: Abdomen is flat.  Musculoskeletal: Normal range of motion.  Skin:    General: Skin is warm.     Capillary Refill: Capillary refill takes less than 2 seconds.  Neurological:     Mental Status: She is alert.     Comments: Pt knows her name and that she's in a hospital.  She is unsure of the year.  She perseverates.  She forgets things you have just told her.  Psychiatric:        Mood and Affect: Mood normal.      ED Treatments / Results  Labs (all labs ordered are listed, but only abnormal results are displayed) Labs Reviewed  COMPREHENSIVE METABOLIC PANEL - Abnormal; Notable for the following components:      Result Value   Potassium 2.9 (*)    Glucose, Bld 128 (*)    All other components within normal limits  URINALYSIS, ROUTINE W REFLEX MICROSCOPIC - Abnormal; Notable for the following components:   Color, Urine STRAW (*)    Hgb urine dipstick MODERATE (*)    Ketones, ur 5 (*)    All other components within normal limits  CULTURE, BLOOD (ROUTINE X 2)  CULTURE, BLOOD (ROUTINE X 2)  URINE CULTURE  CBC WITH DIFFERENTIAL/PLATELET  LACTIC ACID, PLASMA  MAGNESIUM  CBG MONITORING, ED    EKG EKG Interpretation  Date/Time:  Sunday October 18 2018 14:30:04 EDT Ventricular Rate:  96 PR Interval:     QRS  Duration: 75 QT Interval:  490 QTC Calculation: 620 R Axis:   38 Text Interpretation:  Sinus rhythm Low voltage, precordial leads Borderline T abnormalities, diffuse leads Prolonged QT interval No old tracing to compare Confirmed by Jacalyn Lefevre [phone]) on 10/18/2018 2:32:43 PM   Radiology Dg Chest 2 View  Result Date: 10/18/2018 CLINICAL DATA:  Altered level of consciousness with fever EXAM: CHEST - 2 VIEW COMPARISON:  None. FINDINGS: The heart size and mediastinal contours are within normal limits. Both lungs are clear. The visualized skeletal structures are unremarkable. IMPRESSION: No active cardiopulmonary disease. Electronically Signed   By: Alcide Clever M.D.   On: 10/18/2018 15:05   Ct Head Wo Contrast  Result Date: 10/18/2018 CLINICAL DATA:  Onset altered mental status today. EXAM: CT HEAD WITHOUT CONTRAST TECHNIQUE: Contiguous axial images were obtained from the base of the skull through the vertex without intravenous contrast. COMPARISON:  None. FINDINGS: Brain: No evidence of acute infarction, hemorrhage, hydrocephalus, extra-axial collection or mass lesion/mass effect. Vascular: No hyperdense vessel or unexpected calcification. Skull: Intact.  No focal lesion. Sinuses/Orbits: Negative. Other: None. IMPRESSION: Normal head CT. Electronically Signed   By: Drusilla Kanner M.D.   On: 10/18/2018 15:27    Procedures Procedures (including critical care time)  Medications Ordered in ED Medications  potassium chloride SA (K-DUR,KLOR-CON) CR tablet 40 mEq (has no administration in time range)  potassium chloride 10 mEq in 100 mL IVPB (has no administration in time range)  LORazepam (ATIVAN) injection 1 mg (has no administration in time range)  sodium chloride 0.9 % bolus 1,000 mL (1,000 mLs Intravenous New Bag/Given 10/18/18 1518)     Initial Impression / Assessment and Plan / ED Course  I have reviewed the triage vital signs and the nursing notes.  Pertinent labs & imaging  results that were available during my care of the patient were reviewed by me and considered in my medical decision making (see chart for details).    Potassium slightly low and K will be supplemented.  Ct pending.  If that is negative, pt will need a MRI.   Final Clinical Impressions(s) / ED Diagnoses   Final diagnoses:  Altered mental status, unspecified altered mental status type  Hypokalemia    ED Discharge Orders    None       Jacalyn Lefevre, MD 10/18/18 1534

## 2018-10-18 NOTE — Discharge Instructions (Signed)
Eat normally. Your potassium is slightly low. Take potassium pills for 5 days.   Repeat potassium level in a week   Stay hydrated   See your doctor  Return to ER if you have worse confusion, fever, vomiting.

## 2018-10-18 NOTE — ED Triage Notes (Addendum)
Pt brought in by ems after patients sister called ems and reported that about 1.5 hr ago , the patient kept repeating herself and couldn't remember what she had done this morning . Pt alert and oriented x4, grips equal in strength , no nero deficits ; upon ems arrival patient was febrile at 101.3 ems gave 1000mg  tylenol

## 2018-10-18 NOTE — ED Notes (Signed)
Pt going to MRI

## 2018-10-18 NOTE — ED Provider Notes (Signed)
  Physical Exam  BP 123/69   Pulse 82   Temp 98.2 F (36.8 C) (Oral)   Resp 17   Ht 5\' 4"  (1.626 m)   Wt 69.9 kg   SpO2 97%   BMI 26.43 kg/m   Physical Exam  ED Course/Procedures     Procedures  MDM  Care assumed at 4 pm. Patient here with confusion, forgetfulness. ? Fever per EMS but no fevers at home. Workup showed nl WBC, nl lactate, nl UA, nl CXR. CT head pending. If negative, will need MRI brain.   7:19 PM MRI brain unremarkable. Back to baseline. Unclear why she had confusion. Consider underlying dementia. Stable for discharge with outpatient follow up with neurology.      Charlynne Pander, MD 10/18/18 [phone]

## 2018-10-19 LAB — URINE CULTURE: Culture: <10000 — AB

## 2018-10-23 LAB — CULTURE, BLOOD (ROUTINE X 2)
Culture: NO GROWTH
Culture: NO GROWTH
Special Requests: ADEQUATE

## 2018-11-27 ENCOUNTER — Other Ambulatory Visit: Payer: Self-pay | Admitting: Family Medicine

## 2018-11-28 ENCOUNTER — Other Ambulatory Visit: Payer: Self-pay | Admitting: Family Medicine

## 2018-11-30 ENCOUNTER — Telehealth: Payer: Self-pay | Admitting: Family Medicine

## 2018-11-30 ENCOUNTER — Other Ambulatory Visit: Payer: Self-pay | Admitting: *Deleted

## 2018-11-30 DIAGNOSIS — R7303 Prediabetes: Secondary | ICD-10-CM

## 2018-11-30 DIAGNOSIS — E559 Vitamin D deficiency, unspecified: Secondary | ICD-10-CM

## 2018-11-30 DIAGNOSIS — I10 Essential (primary) hypertension: Secondary | ICD-10-CM

## 2018-11-30 DIAGNOSIS — E7800 Pure hypercholesterolemia, unspecified: Secondary | ICD-10-CM

## 2018-11-30 DIAGNOSIS — R7989 Other specified abnormal findings of blood chemistry: Secondary | ICD-10-CM

## 2018-11-30 DIAGNOSIS — Z6831 Body mass index (BMI) 31.0-31.9, adult: Secondary | ICD-10-CM

## 2018-11-30 DIAGNOSIS — E6609 Other obesity due to excess calories: Secondary | ICD-10-CM

## 2018-11-30 NOTE — Telephone Encounter (Signed)
Call placed to patient and patient made aware to go to [facility] at [address]   Faxed orders to [phone].

## 2018-11-30 NOTE — Telephone Encounter (Signed)
Patient is currently scheduled for a physical on 12/11/2018. She would like to know if there is a draw station in [place] that she could go and have blood work done before her appt.  If so can you please order lab work and advise where patient can go.  CB# [phone]

## 2018-12-11 ENCOUNTER — Encounter: Payer: BLUE CROSS/BLUE SHIELD | Admitting: Family Medicine

## 2019-01-06 ENCOUNTER — Encounter: Payer: Self-pay | Admitting: Family Medicine

## 2019-01-06 ENCOUNTER — Ambulatory Visit (INDEPENDENT_AMBULATORY_CARE_PROVIDER_SITE_OTHER): Payer: BC Managed Care – PPO | Admitting: Family Medicine

## 2019-01-06 ENCOUNTER — Other Ambulatory Visit: Payer: Self-pay | Admitting: *Deleted

## 2019-01-06 ENCOUNTER — Other Ambulatory Visit: Payer: Self-pay

## 2019-01-06 VITALS — BP 122/68 | HR 96 | Temp 98.3°F | Resp 16 | Ht 63.0 in | Wt 167.0 lb

## 2019-01-06 DIAGNOSIS — Z0000 Encounter for general adult medical examination without abnormal findings: Secondary | ICD-10-CM

## 2019-01-06 DIAGNOSIS — Z860100 Personal history of colon polyps, unspecified: Secondary | ICD-10-CM

## 2019-01-06 DIAGNOSIS — M179 Osteoarthritis of knee, unspecified: Secondary | ICD-10-CM | POA: Insufficient documentation

## 2019-01-06 DIAGNOSIS — I10 Essential (primary) hypertension: Secondary | ICD-10-CM | POA: Diagnosis not present

## 2019-01-06 DIAGNOSIS — E876 Hypokalemia: Secondary | ICD-10-CM

## 2019-01-06 DIAGNOSIS — Z0001 Encounter for general adult medical examination with abnormal findings: Secondary | ICD-10-CM

## 2019-01-06 DIAGNOSIS — E559 Vitamin D deficiency, unspecified: Secondary | ICD-10-CM

## 2019-01-06 DIAGNOSIS — E7800 Pure hypercholesterolemia, unspecified: Secondary | ICD-10-CM

## 2019-01-06 DIAGNOSIS — R7303 Prediabetes: Secondary | ICD-10-CM

## 2019-01-06 DIAGNOSIS — M170 Bilateral primary osteoarthritis of knee: Secondary | ICD-10-CM

## 2019-01-06 DIAGNOSIS — Z1211 Encounter for screening for malignant neoplasm of colon: Secondary | ICD-10-CM

## 2019-01-06 DIAGNOSIS — M1710 Unilateral primary osteoarthritis, unspecified knee: Secondary | ICD-10-CM | POA: Insufficient documentation

## 2019-01-06 MED ORDER — POTASSIUM CHLORIDE CRYS ER 20 MEQ PO TBCR: EXTENDED_RELEASE_TABLET | ORAL | 0 refills | Status: DC

## 2019-01-06 NOTE — Assessment & Plan Note (Signed)
She has been seen by orthopedics.  Using as needed anti-inflammatory.  But they supply her with a note for work so she can use the closest restroom.

## 2019-01-06 NOTE — Assessment & Plan Note (Signed)
A1c improved

## 2019-01-06 NOTE — Patient Instructions (Addendum)
Calcium 1200mg  once a day  Vitamin D 800IU once a day ( you can take 2 of the 400IU)  Referral to GI for colonoscopy FMLA to be done  I will check on Bone Density  Potassium increased to  3meq once a day for 1 week, then return to 22meq daily  F/U 6 months

## 2019-01-06 NOTE — Assessment & Plan Note (Signed)
Vitamin D is still a little low at 27 discussed the appropriate amount she should be taking.  She had her supplements confused.  Calcium 1200 mg vitamin D 1000 international units

## 2019-01-06 NOTE — Assessment & Plan Note (Signed)
Pressure is controlled.  Her renal function is normal.  No change in medication He does have hypokalemia which is been recurrent some of this may be due to the hydrochlorothiazide that she is also on lisinopril.  I did boost up her potassium take 40 mEq daily for a week then she will return to 20 mEq once a day.

## 2019-01-06 NOTE — Progress Notes (Addendum)
Subjective:    Patient ID: Julie Greene, female    DOB: 08-29-1955, 63 y.o.   MRN: [id number]  Patient presents for Annual Exam (Needs FMLA filled out)    Pt here for CPE . Medications and supplements reviewed  She was in the ER in March with confusion, she had been out in her garden, working. Was feeling fine. She started cooking and then became confused and doesn't remember anything until she came to in the ER.  EMS called, stroke work up done which was negative. No infection found. K was low at  2.9 She had blood in the urine but this in unchanged Taking 20meq of potassium now   Orthopedics- seen by Dr. Madelon Lipsaffrey, last visit in Jan, no steroid shot given, given diclofenac as 75mg  as needed , ultram 50mg  prn, needs note for work to use closest restroom   Hyperlipidemia- taking pravastatin   Dr. Cherly Hensenousins- PAP Smear/Mammogram - Feb 2020  Immunizations: TDAP UTD  Colonoscopy- Due   Fasting labs reviewed at bedside   Repeat K 3.3  A1C 5.9%  LDL 74  Needs updatedFMLA to help with father    Review Of Systems:  GEN- denies fatigue, fever, weight loss,weakness, recent illness HEENT- denies eye drainage, change in vision, nasal discharge, CVS- denies chest pain, palpitations RESP- denies SOB, cough, wheeze ABD- denies N/V, change in stools, abd pain GU- denies dysuria, hematuria, dribbling, incontinence MSK- + joint pain, denies muscle aches, injury Neuro- denies headache, dizziness, syncope, seizure activity       Objective:    BP 122/68   Pulse 96   Temp 98.3 F (36.8 C) (Oral)   Resp 16   Ht 5\' 3"  (1.6 m)   Wt 167 lb (75.8 kg)   SpO2 97%   BMI 29.58 kg/m  GEN- NAD, alert and oriented x3 HEENT- PERRL, EOMI, non injected sclera, pink conjunctiva, MMM, oropharynx clear, TM clear bilat no effusion  Neck- Supple, no thyromegaly, no bruit  CVS- RRR, no murmur RESP-CTAB ABD-NABS,soft,NT,ND EXT- No edema Pulses- Radial, DP- 2+        Assessment & Plan:   Bone density obtain from GYN if done, if not will schedule   Problem List Items Addressed This Visit      Unprioritized   Borderline diabetes    A1c improved      COLONIC POLYPS, HX OF   Relevant Orders   Ambulatory referral to Gastroenterology   Essential hypertension    Pressure is controlled.  Her renal function is normal.  No change in medication He does have hypokalemia which is been recurrent some of this may be due to the hydrochlorothiazide that she is also on lisinopril.  I did boost up her potassium take 40 mEq daily for a week then she will return to 20 mEq once a day.      Hyperlipidemia    Cholesterol  has improved no changes statin drug.  She is continue to work on dietary changes and weight loss with hopes of coming off of her medications.      Hypokalemia   OA (osteoarthritis) of knee    She has been seen by orthopedics.  Using as needed anti-inflammatory.  But they supply her with a note for work so she can use the closest restroom.      Relevant Medications   traMADol (ULTRAM) 50 MG tablet   Vitamin D deficiency    Vitamin D is still a little low at 1127 discussed the  appropriate amount she should be taking.  She had her supplements confused.  Calcium 1200 mg vitamin D 1000 international units       Other Visit Diagnoses    Routine general medical examination at a health care facility    -  Primary   Physical done.  Referral for colonoscopy obtain records from her GYN for mammogram and Pap smear   Colon cancer screening       Relevant Orders   Ambulatory referral to Gastroenterology      Note: This dictation was prepared with Dragon dictation along with smaller phrase technology. Any transcriptional errors that result from this process are unintentional.

## 2019-01-06 NOTE — Assessment & Plan Note (Signed)
Cholesterol  has improved no changes statin drug.  She is continue to work on dietary changes and weight loss with hopes of coming off of her medications.

## 2019-01-25 ENCOUNTER — Other Ambulatory Visit: Payer: Self-pay | Admitting: Family Medicine

## 2019-01-28 ENCOUNTER — Other Ambulatory Visit: Payer: Self-pay | Admitting: Family Medicine

## 2019-01-29 ENCOUNTER — Other Ambulatory Visit: Payer: Self-pay | Admitting: Family Medicine

## 2019-02-01 ENCOUNTER — Encounter: Payer: Self-pay | Admitting: Family Medicine

## 2019-02-01 MED ORDER — LISINOPRIL-HYDROCHLOROTHIAZIDE 20-12.5 MG PO TABS: 1.0000 | ORAL_TABLET | Freq: Every day | ORAL | 3 refills | Status: DC

## 2019-02-01 MED ORDER — POTASSIUM CHLORIDE CRYS ER 20 MEQ PO TBCR: 20.0000 meq | EXTENDED_RELEASE_TABLET | Freq: Every day | ORAL | 3 refills | Status: DC

## 2019-02-01 MED ORDER — AMLODIPINE BESYLATE 5 MG PO TABS: 5.0000 mg | ORAL_TABLET | Freq: Every day | ORAL | 3 refills | Status: DC

## 2019-02-01 MED ORDER — PRAVASTATIN SODIUM 20 MG PO TABS: 20.0000 mg | ORAL_TABLET | Freq: Every day | ORAL | 3 refills | Status: DC

## 2019-02-10 ENCOUNTER — Encounter: Payer: Self-pay | Admitting: *Deleted

## 2019-02-11 ENCOUNTER — Telehealth: Payer: Self-pay | Admitting: *Deleted

## 2019-02-11 DIAGNOSIS — N951 Menopausal and female climacteric states: Secondary | ICD-10-CM

## 2019-02-11 DIAGNOSIS — Z13820 Encounter for screening for osteoporosis: Secondary | ICD-10-CM

## 2019-02-11 DIAGNOSIS — Z780 Asymptomatic menopausal state: Secondary | ICD-10-CM

## 2019-02-11 NOTE — Telephone Encounter (Signed)
Bone density ordered.

## 2019-03-16 LAB — HM DEXA SCAN

## 2019-03-18 ENCOUNTER — Encounter: Payer: Self-pay | Admitting: *Deleted

## 2019-03-23 ENCOUNTER — Encounter: Payer: Self-pay | Admitting: Family Medicine

## 2019-03-23 DIAGNOSIS — M8580 Other specified disorders of bone density and structure, unspecified site: Secondary | ICD-10-CM | POA: Insufficient documentation

## 2019-04-27 ENCOUNTER — Encounter: Payer: Self-pay | Admitting: Family Medicine

## 2019-04-28 ENCOUNTER — Other Ambulatory Visit: Payer: Self-pay

## 2019-04-28 ENCOUNTER — Ambulatory Visit: Payer: BC Managed Care – PPO | Admitting: Family Medicine

## 2019-07-16 ENCOUNTER — Encounter: Payer: Self-pay | Admitting: Family Medicine

## 2019-07-16 ENCOUNTER — Ambulatory Visit (INDEPENDENT_AMBULATORY_CARE_PROVIDER_SITE_OTHER): Payer: BC Managed Care – PPO | Admitting: Family Medicine

## 2019-07-16 ENCOUNTER — Other Ambulatory Visit: Payer: Self-pay

## 2019-07-16 VITALS — BP 128/64 | HR 82 | Temp 98.5°F | Resp 14 | Ht 63.0 in | Wt 166.0 lb

## 2019-07-16 DIAGNOSIS — R7303 Prediabetes: Secondary | ICD-10-CM | POA: Diagnosis not present

## 2019-07-16 DIAGNOSIS — I10 Essential (primary) hypertension: Secondary | ICD-10-CM | POA: Diagnosis not present

## 2019-07-16 DIAGNOSIS — E559 Vitamin D deficiency, unspecified: Secondary | ICD-10-CM

## 2019-07-16 DIAGNOSIS — E7800 Pure hypercholesterolemia, unspecified: Secondary | ICD-10-CM | POA: Diagnosis not present

## 2019-07-16 NOTE — Progress Notes (Signed)
   Subjective:    Patient ID: Julie Greene, female    DOB: June 18, 1956, 63 y.o.   MRN: [id number]  Patient presents for Follow-up (is fasting)  Patient here to follow-up chronic medical problems.  Medications reviewed Hypertension she is taking hydrochlorothiazide 25 mg along with lisinopril.  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  Hyperlipidemia- she is taking statin drug as prescribed without any difficulty, LDL at goal in June 71  Borderline diabetes mellitus last A1c 5.9% in June.  Meds reviewed / Immunizations UTD  Osteopenia seen on Bone Density in August, she also has vitamin D def taking 2000IU  She still has some concerns about her altered mental status episode that she had back in March of this year.  She was seen in the emergency room had scans done of her brain work-up which was all normal the exception of low potassium.  She has not had any further episodes.  Review Of Systems:  GEN- denies fatigue, fever, weight loss,weakness, recent illness HEENT- denies eye drainage, change in vision, nasal discharge, CVS- denies chest pain, palpitations RESP- denies SOB, cough, wheeze ABD- denies N/V, change in stools, abd pain GU- denies dysuria, hematuria, dribbling, incontinence MSK- denies joint pain, muscle aches, injury Neuro- denies headache, dizziness, syncope, seizure activity       Objective:    BP 128/64   Pulse 82   Temp 98.5 F (36.9 C) (Temporal)   Resp 14   Ht 5\' 3"  (1.6 m)   Wt 166 lb (75.3 kg)   SpO2 98%   BMI 29.41 kg/m  GEN- NAD, alert and oriented x3 HEENT- PERRL, EOMI, non injected sclera, pink conjunctiva, Neck- Supple, no thyromegaly CVS- RRR, no murmur RESP-CTAB ABD-NABS,soft,NT,ND Neuro cranial nerves II through XII intact no focal deficits EXT- No edema Pulses- Radial, DP- 2+        Assessment & Plan:      Problem List Items Addressed This Visit      Unprioritized   Borderline diabetes   Relevant Orders   Hemoglobin A1c   Essential hypertension - Primary    Pressures controlled.  No changes in medication.  I will recheck her A1c as she is borderline diabetic.  She has family history of diabetes mellitus.  With regard to the altered mental status still unclear why she had that transient episode.  Imaging did not show any infarct no sign of infection.  Her potassium was a little low but what she describes being quite significant for hypokalemia.  Advised if she does have a recurrent episode then we can get her evaluated by neurology but I do not see anything else to intervene on at this time.      Relevant Orders   CBC with Differential   Comprehensive metabolic panel   TSH   Hyperlipidemia   Relevant Orders   Lipid Panel   Vitamin D deficiency    Vitamin D deficiency in the setting of osteopenia.  She is want to take calcium 1200 mg once a day and continue her vitamin D         Note: This dictation was prepared with Dragon dictation along with smaller phrase technology. Any transcriptional errors that result from this process are unintentional.

## 2019-07-16 NOTE — Assessment & Plan Note (Signed)
Vitamin D deficiency in the setting of osteopenia.  She is want to take calcium 1200 mg once a day and continue her vitamin D

## 2019-07-16 NOTE — Patient Instructions (Addendum)
F/U 6 months for physical  Get calcium 1200mg  once a day  Hydrocortisone 1% cream apply twice a day to any itchy spots

## 2019-07-16 NOTE — Assessment & Plan Note (Signed)
Pressures controlled.  No changes in medication.  I will recheck her A1c as she is borderline diabetic.  She has family history of diabetes mellitus.  With regard to the altered mental status still unclear why she had that transient episode.  Imaging did not show any infarct no sign of infection.  Her potassium was a little low but what she describes being quite significant for hypokalemia.  Advised if she does have a recurrent episode then we can get her evaluated by neurology but I do not see anything else to intervene on at this time.

## 2019-07-17 LAB — HEMOGLOBIN A1C
Hgb A1c MFr Bld: 6 % of total Hgb — ABNORMAL HIGH (ref <5.7)
Mean Plasma Glucose: 126 (calc)
eAG (mmol/L): 7 (calc)

## 2019-07-17 LAB — CBC WITH DIFFERENTIAL/PLATELET
Absolute Monocytes: 382 cells/uL (ref 200–950)
Basophils Absolute: 42 cells/uL (ref 0–200)
Basophils Relative: 0.8 %
Eosinophils Absolute: 143 cells/uL (ref 15–500)
Eosinophils Relative: 2.7 %
HCT: 43 % (ref 35.0–45.0)
Hemoglobin: 14.3 g/dL (ref 11.7–15.5)
Lymphs Abs: 2131 cells/uL (ref 850–3900)
MCH: 30 pg (ref 27.0–33.0)
MCHC: 33.3 g/dL (ref 32.0–36.0)
MCV: 90.1 fL (ref 80.0–100.0)
MPV: 9.3 fL (ref 7.5–12.5)
Monocytes Relative: 7.2 %
Neutro Abs: 2602 cells/uL (ref 1500–7800)
Neutrophils Relative %: 49.1 %
Platelets: 278 10*3/uL (ref 140–400)
RBC: 4.77 10*6/uL (ref 3.80–5.10)
RDW: 13 % (ref 11.0–15.0)
Total Lymphocyte: 40.2 %
WBC: 5.3 10*3/uL (ref 3.8–10.8)

## 2019-07-17 LAB — COMPREHENSIVE METABOLIC PANEL
AG Ratio: 1.3 (calc) (ref 1.0–2.5)
ALT: 16 U/L (ref 6–29)
AST: 18 U/L (ref 10–35)
Albumin: 4.4 g/dL (ref 3.6–5.1)
Alkaline phosphatase (APISO): 85 U/L (ref 37–153)
BUN: 15 mg/dL (ref 7–25)
CO2: 28 mmol/L (ref 20–32)
Calcium: 9.7 mg/dL (ref 8.6–10.4)
Chloride: 104 mmol/L (ref 98–110)
Creat: 0.86 mg/dL (ref 0.50–0.99)
Globulin: 3.3 g/dL (calc) (ref 1.9–3.7)
Glucose, Bld: 93 mg/dL (ref 65–99)
Potassium: 3.8 mmol/L (ref 3.5–5.3)
Sodium: 143 mmol/L (ref 135–146)
Total Bilirubin: 0.5 mg/dL (ref 0.2–1.2)
Total Protein: 7.7 g/dL (ref 6.1–8.1)

## 2019-07-17 LAB — TSH: TSH: 0.49 mIU/L (ref 0.40–4.50)

## 2019-07-17 LAB — LIPID PANEL
Cholesterol: 164 mg/dL (ref <200)
HDL: 51 mg/dL (ref >=50)
LDL Cholesterol (Calc): 96 mg/dL (calc)
Non-HDL Cholesterol (Calc): 113 mg/dL (calc) (ref <130)
Total CHOL/HDL Ratio: 3.2 (calc) (ref <5.0)
Triglycerides: 79 mg/dL (ref <150)

## 2019-08-13 ENCOUNTER — Ambulatory Visit: Payer: BC Managed Care – PPO | Attending: Internal Medicine

## 2019-08-13 DIAGNOSIS — Z20822 Contact with and (suspected) exposure to covid-19: Secondary | ICD-10-CM

## 2019-08-14 LAB — NOVEL CORONAVIRUS, NAA: SARS-CoV-2, NAA: NOT DETECTED

## 2019-08-19 ENCOUNTER — Ambulatory Visit: Payer: BC Managed Care – PPO | Admitting: Podiatry

## 2019-08-19 ENCOUNTER — Other Ambulatory Visit: Payer: Self-pay

## 2019-08-19 ENCOUNTER — Ambulatory Visit (INDEPENDENT_AMBULATORY_CARE_PROVIDER_SITE_OTHER): Payer: BC Managed Care – PPO

## 2019-08-19 VITALS — BP 108/57 | HR 77 | Temp 97.0°F

## 2019-08-19 DIAGNOSIS — M778 Other enthesopathies, not elsewhere classified: Secondary | ICD-10-CM | POA: Diagnosis not present

## 2019-08-19 DIAGNOSIS — M19072 Primary osteoarthritis, left ankle and foot: Secondary | ICD-10-CM

## 2019-08-19 DIAGNOSIS — M79672 Pain in left foot: Secondary | ICD-10-CM

## 2019-08-19 NOTE — Progress Notes (Signed)
  Subjective:  Patient ID: Julie Greene, female    DOB: 07-30-1955,  MRN: [id number]  Chief Complaint  Patient presents with  . Foot Pain    L foot, dorsal midfoot, lateral side. x2 days. Pt stated, "I had a fracture in this area 20 years ago. The same pain has flared up. Rest helps. Pain =  8/10 while walking".    64 y.o. female presents with the above complaint. History confirmed with patient.   Objective:  Physical Exam: warm, good capillary refill, no trophic changes or ulcerative lesions, normal DP and PT pulses and normal sensory exam. Left Foot: tenderness over the left 4th/5th tarsometatarsal joints    No images are attached to the encounter.  Radiographs: X-ray of the left foot: shows DJD changes, likely chronic   Assessment:   1. Arthritis of left foot   2. Capsulitis of left foot      Plan:  Patient was evaluated and treated and all questions answered.  Arthritis and Capsulitis -Educated on etiology -XR reviewed with patient -Injection delivered to the painful joint -Advised continued taking of Voltaren for pain.  Procedure: Joint Injection Location: Left 4th TMT joint Skin Prep: Alcohol. Injectate: 0.5 cc 1% lidocaine plain, 0.5 cc dexamethasone phosphate. Disposition: Patient tolerated procedure well. Injection site dressed with a band-aid.   Return in about 6 weeks (around 09/30/2019) for Arthritis, Left.

## 2019-08-23 ENCOUNTER — Other Ambulatory Visit: Payer: Self-pay | Admitting: Podiatry

## 2019-08-23 DIAGNOSIS — M19072 Primary osteoarthritis, left ankle and foot: Secondary | ICD-10-CM

## 2019-09-24 LAB — HM MAMMOGRAPHY

## 2019-10-04 ENCOUNTER — Telehealth: Payer: Self-pay | Admitting: *Deleted

## 2019-10-04 NOTE — Telephone Encounter (Signed)
Inquired as to if the COVID Vaccine is appropriate for patient.    Advised that it is the belief of [facility] that all patient's would benefit from the vaccine. Advised that there are no interactions/ contraindications noted with vaccination at this time.   COVID Vaccination Information As of right now, we will not be giving COVID-19 vaccines here in our office. It is too many storage and administrating regulations that our office is not equipped to provide at this time.    You can go online at https://covid19.ncdhhs.gov/findyourspot   That website will give you information on all counties.    If not here are the numbers you can call. [facility] - [phone] [facility] - [phone] or [phone] [facility] - [phone] opt.2 [facility] - [phone]   You can also find information on [email or web address] or call the state's COVID-19 information phone number at 211.   

## 2019-10-13 ENCOUNTER — Encounter: Payer: Self-pay | Admitting: *Deleted

## 2019-11-05 ENCOUNTER — Encounter: Payer: Self-pay | Admitting: Family Medicine

## 2019-11-06 ENCOUNTER — Encounter: Payer: Self-pay | Admitting: Family Medicine

## 2019-11-12 ENCOUNTER — Other Ambulatory Visit: Payer: BC Managed Care – PPO

## 2019-11-12 ENCOUNTER — Other Ambulatory Visit: Payer: Self-pay

## 2019-11-12 DIAGNOSIS — E559 Vitamin D deficiency, unspecified: Secondary | ICD-10-CM

## 2019-11-12 DIAGNOSIS — R7303 Prediabetes: Secondary | ICD-10-CM

## 2019-11-12 DIAGNOSIS — E6609 Other obesity due to excess calories: Secondary | ICD-10-CM

## 2019-11-12 DIAGNOSIS — Z6831 Body mass index (BMI) 31.0-31.9, adult: Secondary | ICD-10-CM

## 2019-11-12 DIAGNOSIS — E7800 Pure hypercholesterolemia, unspecified: Secondary | ICD-10-CM

## 2019-11-12 DIAGNOSIS — I10 Essential (primary) hypertension: Secondary | ICD-10-CM

## 2019-11-12 DIAGNOSIS — R7989 Other specified abnormal findings of blood chemistry: Secondary | ICD-10-CM

## 2019-11-13 LAB — COMPLETE METABOLIC PANEL WITH GFR
AG Ratio: 1.5 (calc) (ref 1.0–2.5)
ALT: 22 U/L (ref 6–29)
AST: 21 U/L (ref 10–35)
Albumin: 4.5 g/dL (ref 3.6–5.1)
Alkaline phosphatase (APISO): 73 U/L (ref 37–153)
BUN: 11 mg/dL (ref 7–25)
CO2: 30 mmol/L (ref 20–32)
Calcium: 9.4 mg/dL (ref 8.6–10.4)
Chloride: 104 mmol/L (ref 98–110)
Creat: 0.8 mg/dL (ref 0.50–0.99)
GFR, Est African American: 90 mL/min/{1.73_m2} (ref >=60)
GFR, Est Non African American: 78 mL/min/{1.73_m2} (ref >=60)
Globulin: 3 g/dL (calc) (ref 1.9–3.7)
Glucose, Bld: 119 mg/dL — ABNORMAL HIGH (ref 65–99)
Potassium: 4.3 mmol/L (ref 3.5–5.3)
Sodium: 142 mmol/L (ref 135–146)
Total Bilirubin: 0.5 mg/dL (ref 0.2–1.2)
Total Protein: 7.5 g/dL (ref 6.1–8.1)

## 2019-11-13 LAB — LIPID PANEL
Cholesterol: 171 mg/dL (ref <200)
HDL: 52 mg/dL (ref >=50)
LDL Cholesterol (Calc): 102 mg/dL (calc) — ABNORMAL HIGH
Non-HDL Cholesterol (Calc): 119 mg/dL (calc) (ref <130)
Total CHOL/HDL Ratio: 3.3 (calc) (ref <5.0)
Triglycerides: 82 mg/dL (ref <150)

## 2019-11-13 LAB — CBC WITH DIFFERENTIAL/PLATELET
Absolute Monocytes: 317 cells/uL (ref 200–950)
Basophils Absolute: 31 cells/uL (ref 0–200)
Basophils Relative: 0.6 %
Eosinophils Absolute: 172 cells/uL (ref 15–500)
Eosinophils Relative: 3.3 %
HCT: 44.1 % (ref 35.0–45.0)
Hemoglobin: 14.4 g/dL (ref 11.7–15.5)
Lymphs Abs: 1706 cells/uL (ref 850–3900)
MCH: 29.9 pg (ref 27.0–33.0)
MCHC: 32.7 g/dL (ref 32.0–36.0)
MCV: 91.7 fL (ref 80.0–100.0)
MPV: 8.9 fL (ref 7.5–12.5)
Monocytes Relative: 6.1 %
Neutro Abs: 2974 cells/uL (ref 1500–7800)
Neutrophils Relative %: 57.2 %
Platelets: 297 10*3/uL (ref 140–400)
RBC: 4.81 10*6/uL (ref 3.80–5.10)
RDW: 13.6 % (ref 11.0–15.0)
Total Lymphocyte: 32.8 %
WBC: 5.2 10*3/uL (ref 3.8–10.8)

## 2019-11-13 LAB — TSH: TSH: 0.5 mIU/L (ref 0.40–4.50)

## 2019-11-13 LAB — VITAMIN D 25 HYDROXY (VIT D DEFICIENCY, FRACTURES): Vit D, 25-Hydroxy: 27 ng/mL — ABNORMAL LOW (ref 30–100)

## 2019-11-13 LAB — HEMOGLOBIN A1C
Hgb A1c MFr Bld: 5.9 % of total Hgb — ABNORMAL HIGH (ref <5.7)
Mean Plasma Glucose: 123 (calc)
eAG (mmol/L): 6.8 (calc)

## 2019-11-15 ENCOUNTER — Ambulatory Visit (INDEPENDENT_AMBULATORY_CARE_PROVIDER_SITE_OTHER): Payer: BC Managed Care – PPO | Admitting: Family Medicine

## 2019-11-15 ENCOUNTER — Encounter: Payer: Self-pay | Admitting: Family Medicine

## 2019-11-15 ENCOUNTER — Other Ambulatory Visit: Payer: Self-pay

## 2019-11-15 VITALS — BP 110/66 | HR 83 | Temp 98.1°F | Resp 14 | Ht 63.0 in | Wt 168.0 lb

## 2019-11-15 DIAGNOSIS — G8190 Hemiplegia, unspecified affecting unspecified side: Secondary | ICD-10-CM

## 2019-11-15 DIAGNOSIS — R202 Paresthesia of skin: Secondary | ICD-10-CM

## 2019-11-15 DIAGNOSIS — I10 Essential (primary) hypertension: Secondary | ICD-10-CM

## 2019-11-15 DIAGNOSIS — R7303 Prediabetes: Secondary | ICD-10-CM

## 2019-11-15 DIAGNOSIS — R29818 Other symptoms and signs involving the nervous system: Secondary | ICD-10-CM

## 2019-11-15 DIAGNOSIS — R42 Dizziness and giddiness: Secondary | ICD-10-CM | POA: Diagnosis not present

## 2019-11-15 NOTE — Patient Instructions (Addendum)
MRI  to be done of your brain with contrast  Vitamin D take between 2000-4000IU once a day  FOr your blood pressure, do not take novasc (amlodipine 5mg ) if your blood pressure is < 120/80 F/U pending results

## 2019-11-15 NOTE — Progress Notes (Signed)
Subjective:    Patient ID: Julie Greene, female    DOB: 1956/02/13, 64 y.o.   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  Dizzy spells over the pat month. The speels occur when she is sitting down, she often gets lightheaded first then gets dizzy. She had 4 severe spells over the past few weeks, spells last a few minutes then stip  no nausea or vomiting associated   No chest pain or prolems breathing   HTN- she has not checked bp recently , she did have BP checked with nurse at work and was normal, during the first spe;   She has been taking tumeric and during the week of the new the past month she is not sure if this was okay with her  Pt off Friday/  May 20 and  21   Or after 3:30 pm   Notalgia parestheia  Review Of Systems:  GEN- denies fatigue, fever, weight loss,weakness, recent illness HEENT- denies eye drainage, change in vision, nasal discharge, CVS- denies chest pain, palpitations RESP- denies SOB, cough, wheeze ABD- denies N/V, change in stools, abd pain GU- denies dysuria, hematuria, dribbling, incontinence MSK- denies joint pain, muscle aches, injury Neuro- denies headache, +dizziness, syncope, seizure activity       Objective:    BP 110/66   Pulse 83   Temp 98.1 F (36.7 C) (Temporal)   Resp 14   Ht 5\' 3"  (1.6 m)   Wt 168 lb (76.2 kg)   SpO2 98%   BMI 29.76 kg/m  GEN- NAD, alert and oriented x3 HEENT- PERRL, EOMI, non injected sclera, pink conjunctiva, MMM, oropharynx clear,TM lear no  effusion Neck- Supple, no thyromegaly, no bruit  CVS- RRR, no murmur RESP-CTAB ABD-NABS,soft,NT,ND NEURO-CNII-XII grossly in tact ( exclude 7 ),  switching  Of right eyelid noted, mild ptosis of right eyeli  EXT- No edema Pulses- Radial, DP- 2+        Assessment & Plan:      Problem List Items Addressed This Visit      Unprioritized   Borderline diabetes - Primary   Essential hypertension    Blood pressure looks good here in the office.  Did advise her she has a spell scan to check her blood pressure make sure that it is not low causing some of the dizzy spells.  If she does have BP less than 120/80 with symptoms she should stop her amlodipine. With regards to her fasting labs today were unremarkable   Regarding the facial hemiparesis with twitching and ptosis of the eyelid will obtain MRI of the brain to rule out cranial nerve compression or mass this was discussed with patient.  He also has chronic itching irritation on one spot on her back diagnosed with notalgia paresthetica a few years ago.  She uses topical lotions and creams for this.  She want to know if she needed gabapentin since it is only one spot and not all the time recommend she hold off on this.  Regards  to the turmeric which started around the same time as her symptoms I think that this is less likely but she is just going to hold the supplements for now      Notalgia paresthetica    Other Visit Diagnoses    Dizzy spells       Relevant Orders   MR Brain W Wo Contrast   Other symptoms and signs involving the nervous system       Relevant Orders   MR Brain W Wo Contrast   Facial hemiparesis ([place])       Relevant Orders   MR Brain W Wo Contrast      Note: This dictation was prepared with Dragon dictation along with smaller phrase technology. Any transcriptional errors that result from this process are unintentional.

## 2019-11-15 NOTE — Assessment & Plan Note (Signed)
Blood pressure looks good here in the office.  Did advise her she has a spell scan to check her blood pressure make sure that it is not low causing some of the dizzy spells.  If she does have BP less than 120/80 with symptoms she should stop her amlodipine. With regards to her fasting labs today were unremarkable   Regarding the facial hemiparesis with twitching and ptosis of the eyelid will obtain MRI of the brain to rule out cranial nerve compression or mass this was discussed with patient.  He also has chronic itching irritation on one spot on her back diagnosed with notalgia paresthetica a few years ago.  She uses topical lotions and creams for this.  She want to know if she needed gabapentin since it is only one spot and not all the time recommend she hold off on this.  Regards to the turmeric which started around the same time as her symptoms I think that this is less likely but she is just going to hold the supplements for now

## 2019-11-21 ENCOUNTER — Encounter: Payer: Self-pay | Admitting: Family Medicine

## 2019-11-29 ENCOUNTER — Encounter: Payer: Self-pay | Admitting: Family Medicine

## 2019-12-15 ENCOUNTER — Ambulatory Visit
Admission: RE | Admit: 2019-12-15 | Discharge: 2019-12-15 | Disposition: A | Discharge status: Home / Self Care | Payer: BC Managed Care – PPO | Source: Ambulatory Visit | Attending: Family Medicine | Admitting: Family Medicine

## 2019-12-15 DIAGNOSIS — R42 Dizziness and giddiness: Secondary | ICD-10-CM

## 2019-12-15 DIAGNOSIS — G8190 Hemiplegia, unspecified affecting unspecified side: Secondary | ICD-10-CM

## 2019-12-15 DIAGNOSIS — R29818 Other symptoms and signs involving the nervous system: Secondary | ICD-10-CM

## 2019-12-15 MED ORDER — GADOBENATE DIMEGLUMINE 529 MG/ML IV SOLN
15.0000 mL | Freq: Once | INTRAVENOUS | Status: AC | PRN
  Administered 2019-12-15: 15 mL via INTRAVENOUS

## 2019-12-20 ENCOUNTER — Other Ambulatory Visit: Payer: Self-pay | Admitting: *Deleted

## 2019-12-20 DIAGNOSIS — R42 Dizziness and giddiness: Secondary | ICD-10-CM

## 2019-12-20 DIAGNOSIS — R202 Paresthesia of skin: Secondary | ICD-10-CM

## 2019-12-20 DIAGNOSIS — G8190 Hemiplegia, unspecified affecting unspecified side: Secondary | ICD-10-CM

## 2019-12-23 ENCOUNTER — Encounter: Payer: Self-pay | Admitting: Family Medicine

## 2020-01-14 ENCOUNTER — Encounter: Payer: BC Managed Care – PPO | Admitting: Family Medicine

## 2020-01-25 ENCOUNTER — Ambulatory Visit: Payer: BC Managed Care – PPO | Admitting: Neurology

## 2020-01-29 ENCOUNTER — Other Ambulatory Visit: Payer: Self-pay | Admitting: Family Medicine

## 2020-04-19 ENCOUNTER — Telehealth: Payer: Self-pay | Admitting: *Deleted

## 2020-04-19 ENCOUNTER — Encounter: Payer: Self-pay | Admitting: Neurology

## 2020-04-19 DIAGNOSIS — R42 Dizziness and giddiness: Secondary | ICD-10-CM

## 2020-04-19 DIAGNOSIS — G8190 Hemiplegia, unspecified affecting unspecified side: Secondary | ICD-10-CM

## 2020-04-19 NOTE — Telephone Encounter (Signed)
Received call from patient.   Reports that she is continuing to have episodes of vertigo. Reports that episodes are becoming more frequent (2-3x per week). States that MRI was performed in May 2021 that showed no cause, but PCP recommended neuro referral if Sx did not improve.  Reports that she would like referral at this time.   Ok to order?

## 2020-04-19 NOTE — Telephone Encounter (Signed)
Neurology referral placed due to ongoing symptoms

## 2020-05-05 ENCOUNTER — Encounter: Payer: Self-pay | Admitting: Family Medicine

## 2020-05-12 ENCOUNTER — Other Ambulatory Visit: Payer: Self-pay

## 2020-05-12 ENCOUNTER — Ambulatory Visit: Payer: Medicare Other | Admitting: Neurology

## 2020-05-12 ENCOUNTER — Encounter: Payer: Self-pay | Admitting: Neurology

## 2020-05-12 VITALS — BP 132/81 | HR 75 | Ht 63.0 in | Wt 170.5 lb

## 2020-05-12 DIAGNOSIS — R410 Disorientation, unspecified: Secondary | ICD-10-CM | POA: Diagnosis not present

## 2020-05-12 DIAGNOSIS — G5131 Clonic hemifacial spasm, right: Secondary | ICD-10-CM | POA: Insufficient documentation

## 2020-05-12 NOTE — Progress Notes (Signed)
Chief Complaint  Patient presents with  . New Patient (Initial Visit)    Reports she has been diagnosed with right hemifacial spasms. She has never had treatment for this condition. Orthostatic Vitals: Lying: 132/81, 75, Sitting: 137/82, 83, Standing: 136/83, 85, Standing x 3: 139/84, 86. She has dizziness with positional changes.   Marland Kitchen PCP    Jeanice Lim, Velna Hatchet, MD    HISTORICAL  Julie Greene is a 64 year old female, seen in request by her primary care doctor [place], [place] F for evaluation of right hemifacial spasm, initial evaluation was on May 12, 2020   I reviewed and summarized the referring note. PMHx  HLD HTN  She was treated at emergency room in March 2020, for sudden onset confusion, forgetfulness, lasting for few hours,  Personally reviewed MRI of the brain in March 2020 was normal, no acute abnormality, potassium was 2.9, this happened the day after she worked in the yard for extended period of time  Since then, she began to notice intermittent muscle twitching around her right eye, gradually getting worse over the past 1 year, was diagnosed with right hemifacial spasm by outside ophthalmologist, referred here for treatment, she denies visual loss, no sensory loss, no lateralized motor or sensory deficit.  I personally reviewed MRI of the brain with without contrast on Dec 16, 2019 no acute abnormality, few scattered supratentorium small vessel disease  Most recent laboratory evaluation in April 2021, normal TSH 0.5, vitamin D 27, A1c 5.9, lipid panel LDL 102, cholesterol 171, CMP showed glucose of 119, normal CBC hemoglobin of 14.4   Since April 2021, she also complains of intermittent dizziness, is usually happen with sudden positional change, she usually only drink 12 ounces of water daily,  REVIEW OF SYSTEMS: Full 14 system review of systems performed and notable only for as above All other review of systems were negative.  ALLERGIES: Allergies  Allergen  Reactions  . Sulfonamide Derivatives     REACTION: hives    HOME MEDICATIONS: Current Outpatient Medications  Medication Sig Dispense Refill  . amLODipine (NORVASC) 5 MG tablet TAKE 1 TABLET BY MOUTH EVERY DAY 90 tablet 3  . aspirin 81 MG tablet Take 81 mg by mouth daily.    . Cholecalciferol (VITAMIN D) 50 MCG (2000 UT) CAPS Take by mouth.    . diclofenac (VOLTAREN) 75 MG EC tablet TAKE 1 TABLET BY MOUTH TWICE A DAY 180 tablet 1  . Echinacea 125 MG CAPS Take 1 capsule by mouth daily.    Marland Kitchen KLOR-CON M20 20 MEQ tablet TAKE 1 TABLET BY MOUTH EVERY DAY 90 tablet 3  . lisinopril-hydrochlorothiazide (ZESTORETIC) 20-12.5 MG tablet TAKE 1 TABLET BY MOUTH EVERY DAY 90 tablet 3  . pravastatin (PRAVACHOL) 20 MG tablet TAKE 1 TABLET BY MOUTH EVERY DAY 90 tablet 3  . traMADol (ULTRAM) 50 MG tablet Take 50 mg by mouth every 6 (six) hours as needed.     . TURMERIC PO Take 1 tablet by mouth daily.     . valACYclovir (VALTREX) 500 MG tablet Take 500 mg by mouth daily.      No current facility-administered medications for this visit.    PAST MEDICAL HISTORY: Past Medical History:  Diagnosis Date  . COLONIC POLYPS, HX OF 09/22/2009  . GERD 09/22/2009  . HEMATURIA, MICROSCOPIC, HX OF 09/22/2009  . Hx of colonoscopy 08/29/2008  . HYPERLIPIDEMIA 09/22/2009  . HYPERTENSION 01/22/2007  . Impaired glucose tolerance 01/06/2011  . Vertigo     PAST SURGICAL  HISTORY: Past Surgical History:  Procedure Laterality Date  . NO PAST SURGERIES      FAMILY HISTORY: Family History  Problem Relation Age of Onset  . Diabetes Mother   . Hyperlipidemia Mother   . Hypertension Mother   . Heart disease Mother   . Diabetes Sister   . Thyroid disease Sister   . Cancer Paternal Grandmother   . Hypertension Sister   . Hyperlipidemia Sister   . Hyperlipidemia Sister   . Hypertension Sister   . Diabetes Sister   . Dementia Father     SOCIAL HISTORY: Social History   Socioeconomic History  . Marital status:  Single    Spouse name: Not on file  . Number of children: 0  . Years of education: 29  . Highest education level: Not on file  Occupational History  . Occupation: Media planner: [employer]  Tobacco Use  . Smoking status: Never Smoker  . Smokeless tobacco: Never Used  . Tobacco comment: Rare social in the past only  Substance and Sexual Activity  . Alcohol use: Yes    Alcohol/week: 2.0 standard drinks    Types: 2 drink(s) per week    Comment: socially  . Drug use: No  . Sexual activity: Yes    Partners: Male    Birth control/protection: Post-menopausal  Other Topics Concern  . 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 Looking for work (March '14). Single. No children. Dating - monogamous -28. Lives alone. No history or abuse. Right-handed. No daily use of caffeine.         Social Determinants of Health   Financial Resource Strain:   . Difficulty of Paying Living Expenses: Not on file  Food Insecurity:   . Worried About Programme researcher, broadcasting/film/video in the Last Year: Not on file  . Ran Out of Food in the Last Year: Not on file  Transportation Needs:   . Lack of Transportation (Medical): Not on file  . Lack of Transportation (Non-Medical): Not on file  Physical Activity:   . Days of Exercise per Week: Not on file  . Minutes of Exercise per Session: Not on file  Stress:   . Feeling of Stress : Not on file  Social Connections:   . Frequency of Communication with Friends and Family: Not on file  . Frequency of Social Gatherings with Friends and Family: Not on file  . Attends Religious Services: Not on file  . Active Member of Clubs or Organizations: Not on file  . Attends Banker Meetings: Not on file  . Marital Status: Not on file  Intimate Partner Violence:   . Fear of Current or Ex-Partner: Not on file  . Emotionally Abused: Not on file  .  Physically Abused: Not on file  . Sexually Abused: Not on file     PHYSICAL EXAM   Vitals:   05/12/20 0909  BP: 132/81  Pulse: 75  Weight: 170 lb 8 oz (77.3 kg)  Height: 5\' 3"  (1.6 m)   Not recorded     Body mass index is 30.2 kg/m.  PHYSICAL EXAMNIATION:  Gen: NAD, conversant, well nourised, well groomed                     Cardiovascular: Regular rate rhythm, no peripheral edema, warm, nontender. Eyes: Conjunctivae clear without exudates or hemorrhage Neck: Supple,  no carotid bruits. Pulmonary: Clear to auscultation bilaterally   NEUROLOGICAL EXAM:  MENTAL STATUS: Speech:    Speech is normal; fluent and spontaneous with normal comprehension.  Cognition:     Orientation to time, place and person     Normal recent and remote memory     Normal Attention span and concentration     Normal Language, naming, repeating,spontaneous speech     Fund of knowledge   CRANIAL NERVES: CN II: Visual fields are full to confrontation. Pupils are round equal and briskly reactive to light. CN III, IV, VI: extraocular movement are normal. No ptosis. CN V: Facial sensation is intact to light touch CN VII: Frequent right hemifacial muscle spasm, mainly around right orbicularis oculi CN VIII: Hearing is normal to causal conversation. CN IX, X: Phonation is normal. CN XI: Head turning and shoulder shrug are intact  MOTOR: There is no pronator drift of out-stretched arms. Muscle bulk and tone are normal. Muscle strength is normal.  REFLEXES: Reflexes are 2+ and symmetric at the biceps, triceps, knees, and ankles. Plantar responses are flexor.  SENSORY: Intact to light touch, pinprick and vibratory sensation are intact in fingers and toes.  COORDINATION: There is no trunk or limb dysmetria noted.  GAIT/STANCE: Posture is normal. Gait is steady with normal steps, base, arm swing, and turning. Heel and toe walking are normal. Tandem gait is normal.  Romberg is  absent.   DIAGNOSTIC DATA (LABS, IMAGING, TESTING) - I reviewed patient records, labs, notes, testing and imaging myself where available.   ASSESSMENT AND PLAN  LINSIE LUPO is a 64 y.o. female   Right hemifacial spasm  EMG guided Xeomin injection  Positional related dizziness,  There is no orthostatic blood pressure changes on today's examination, lying down 132/81, 75; sitting 136/83, 85; standing 137/82, 85; standing 3 minutes 139/84, 86  Differentiation diagnosis including dehydration, hypoperfusion of the brain,  Will proceed with echocardiogram, ultrasound of carotid artery   Levert Feinstein, M.D. Ph.D.  [facility] [address] [place], [place] [address] Ph: [phone] Fax: [phone]  CC:  Salley Scarlet, MD [address] [place],  [place] [address]  Salley Scarlet, MD

## 2020-05-22 ENCOUNTER — Telehealth: Payer: Self-pay | Admitting: Neurology

## 2020-05-22 DIAGNOSIS — S0083XA Contusion of other part of head, initial encounter: Secondary | ICD-10-CM | POA: Diagnosis not present

## 2020-05-22 NOTE — Telephone Encounter (Signed)
Received charge sheet & consent form for patient to have Xeomin (50U) for G51.8, G51.31. I called [employer] [phone]) and spoke with Erie Noe to see if H4174 and 08144 require PA. She states they will not require authorization. [facility] is the specialty pharmacy [phone]). Reference #[id number]. I called [employer] and spoke with Marchelle Folks to initiate PA. She provided me with the pending #, [id number].

## 2020-05-23 ENCOUNTER — Ambulatory Visit (HOSPITAL_COMMUNITY)
Admission: RE | Admit: 2020-05-23 | Discharge: 2020-05-23 | Disposition: A | Discharge status: Home / Self Care | Payer: Medicare Other | Source: Ambulatory Visit | Attending: Neurology | Admitting: Neurology

## 2020-05-23 ENCOUNTER — Other Ambulatory Visit: Payer: Self-pay

## 2020-05-23 ENCOUNTER — Ambulatory Visit (HOSPITAL_BASED_OUTPATIENT_CLINIC_OR_DEPARTMENT_OTHER)
Admission: RE | Admit: 2020-05-23 | Discharge: 2020-05-23 | Disposition: A | Discharge status: Home / Self Care | Payer: Medicare Other | Source: Ambulatory Visit | Attending: Neurology | Admitting: Neurology

## 2020-05-23 DIAGNOSIS — E785 Hyperlipidemia, unspecified: Secondary | ICD-10-CM | POA: Diagnosis not present

## 2020-05-23 DIAGNOSIS — G5131 Clonic hemifacial spasm, right: Secondary | ICD-10-CM

## 2020-05-23 DIAGNOSIS — I10 Essential (primary) hypertension: Secondary | ICD-10-CM | POA: Diagnosis not present

## 2020-05-23 DIAGNOSIS — G459 Transient cerebral ischemic attack, unspecified: Secondary | ICD-10-CM | POA: Insufficient documentation

## 2020-05-23 DIAGNOSIS — R410 Disorientation, unspecified: Secondary | ICD-10-CM

## 2020-05-23 NOTE — Progress Notes (Signed)
  Echocardiogram 2D Echocardiogram has been performed.  [name] 05/23/2020, 3:22 PM

## 2020-05-23 NOTE — Progress Notes (Signed)
Carotid duplex has been completed.   Preliminary results in CV Proc.   Blanch Media 05/23/2020 2:09 PM

## 2020-05-23 NOTE — Telephone Encounter (Signed)
Received fax from [employer] stating more information needed for PA. I faxed clinical notes to [employer].

## 2020-05-24 ENCOUNTER — Telehealth: Payer: Self-pay | Admitting: Neurology

## 2020-05-24 LAB — ECHOCARDIOGRAM COMPLETE
AR max vel: 1.8 cm2
AV Area VTI: 1.82 cm2
AV Area mean vel: 1.64 cm2
AV Mean grad: 3 mmHg
AV Peak grad: 4.8 mmHg
Ao pk vel: 1.09 m/s
Area-P 1/2: 4.15 cm2
S' Lateral: 2.9 cm

## 2020-05-24 NOTE — Telephone Encounter (Signed)
-----   Message from Lilla Shook, RN sent at 05/24/2020  9:05 AM EDT ----- Dr. Terrace Arabia is out this week. Thank you. ----- Message ----- From: Interface, [employer] Sent: 05/24/2020  12:44 AM EDT To: Levert Feinstein, MD

## 2020-05-24 NOTE — Telephone Encounter (Signed)
I called the patient.  The 2D echocardiogram and the carotid Doppler studies were unremarkable.  The studies were done due to some postural dizziness that the patient was having.  2D echo 05/23/20:  IMPRESSIONS    1. Left ventricular ejection fraction, by estimation, is 55 to 60%. The left ventricle has normal function. The left ventricle has no regional wall motion abnormalities. There is mild left ventricular hypertrophy. Left ventricular diastolic parameters  were normal.  2. Right ventricular systolic function is normal. The right ventricular size is normal. Tricuspid regurgitation signal is inadequate for assessing PA pressure.  3. The mitral valve is normal in structure. No evidence of mitral valve regurgitation. No evidence of mitral stenosis.  4. The aortic valve is tricuspid. Aortic valve regurgitation is not visualized. No aortic stenosis is present.   Carotid doppler 05/23/20:  Summary:  Right Carotid: Velocities in the right ICA are consistent with a 1-39%  stenosis.   Left Carotid: Velocities in the left ICA are consistent with a 1-39%  stenosis.   Vertebrals: Bilateral vertebral arteries demonstrate antegrade flow.

## 2020-05-30 NOTE — Telephone Encounter (Signed)
I received a denial for [employer] for coverage of Xeomin. The letter states Xeomin is not approved for clonic hemifacial spasm. The letter gives directions to make an appeal. How should we proceed?

## 2020-05-30 NOTE — Telephone Encounter (Signed)
Please find out which botulism toxin her insurance would cover,  We will try Botox a 100 units versus Dysport 300 units, if her insurance covers

## 2020-06-06 ENCOUNTER — Other Ambulatory Visit: Payer: Self-pay

## 2020-06-06 ENCOUNTER — Encounter: Payer: Self-pay | Admitting: Family Medicine

## 2020-06-06 MED ORDER — DICLOFENAC SODIUM 75 MG PO TBEC: 75.0000 mg | DELAYED_RELEASE_TABLET | Freq: Two times a day (BID) | ORAL | 1 refills | Status: DC

## 2020-06-15 NOTE — Telephone Encounter (Signed)
Received approval for Dysport. PA #[id number] (06/15/20- 09/15/20). I will call patient to schedule once there is an opening. FYI

## 2020-06-15 NOTE — Telephone Encounter (Signed)
Submitted PA for Dysport 300U using CMM. Waiting for response.

## 2020-06-19 NOTE — Telephone Encounter (Signed)
I called the patient and had a long discussion with her regarding starting Dysport for hemifacial spasm. Patient had several questions regarding the cost and possible side effects. I disclosed all side effects that come with the medication per the manufacturer. I tried to encourage patient and let her know that most patients have a great experience. Patient still seemed apprehensive. As far as the cost, I let the patient know that she would pay for the cost of the medication up front with the specialty pharmacy before shipment (if applicable). Advised that she may or may not receive a bill for the procedure itself after the injection once insurance decides coverage. Patient seemed to understand all of this. Patient states that she would like to wait at this time. She may consider injections at a later date. I provided her with my contact information. She states that she may be open to trying other treatment/medication options, if any, if Dr. Terrace Arabia sees fit. FYI

## 2020-07-06 ENCOUNTER — Telehealth: Payer: Self-pay | Admitting: *Deleted

## 2020-07-06 NOTE — Telephone Encounter (Signed)
Received call from patient.   Reports that she that she had dropped off (2) forms for handicap plates for her cars. States that only (1) was signed.   Advised that only (1) form was received signed. Advised that if multiple forms were dropped off, it may not have been clear that it was not a duplicate.   Advised that we can send out a blank form for her to complete about the car that was not on prior signed form.

## 2020-07-07 NOTE — Telephone Encounter (Signed)
DMV form signed

## 2020-07-10 ENCOUNTER — Ambulatory Visit: Payer: BC Managed Care – PPO | Admitting: Neurology

## 2020-08-09 ENCOUNTER — Other Ambulatory Visit: Payer: Self-pay

## 2020-08-14 ENCOUNTER — Encounter: Payer: Medicare Other | Admitting: Family Medicine

## 2020-08-17 ENCOUNTER — Other Ambulatory Visit: Payer: Medicare Other

## 2020-08-17 ENCOUNTER — Other Ambulatory Visit: Payer: Self-pay

## 2020-08-17 DIAGNOSIS — Z136 Encounter for screening for cardiovascular disorders: Secondary | ICD-10-CM | POA: Diagnosis not present

## 2020-08-17 DIAGNOSIS — E7800 Pure hypercholesterolemia, unspecified: Secondary | ICD-10-CM | POA: Diagnosis not present

## 2020-08-17 DIAGNOSIS — I10 Essential (primary) hypertension: Secondary | ICD-10-CM | POA: Diagnosis not present

## 2020-08-17 DIAGNOSIS — E559 Vitamin D deficiency, unspecified: Secondary | ICD-10-CM | POA: Diagnosis not present

## 2020-08-17 DIAGNOSIS — R7303 Prediabetes: Secondary | ICD-10-CM | POA: Diagnosis not present

## 2020-08-17 DIAGNOSIS — Z13220 Encounter for screening for lipoid disorders: Secondary | ICD-10-CM

## 2020-08-18 LAB — COMPLETE METABOLIC PANEL WITH GFR
AG Ratio: 1.4 (calc) (ref 1.0–2.5)
ALT: 18 U/L (ref 6–29)
AST: 20 U/L (ref 10–35)
Albumin: 4.3 g/dL (ref 3.6–5.1)
Alkaline phosphatase (APISO): 88 U/L (ref 37–153)
BUN: 11 mg/dL (ref 7–25)
CO2: 32 mmol/L (ref 20–32)
Calcium: 9.4 mg/dL (ref 8.6–10.4)
Chloride: 102 mmol/L (ref 98–110)
Creat: 0.87 mg/dL (ref 0.50–0.99)
GFR, Est African American: 81 mL/min/{1.73_m2} (ref >=60)
GFR, Est Non African American: 70 mL/min/{1.73_m2} (ref >=60)
Globulin: 3.1 g/dL (calc) (ref 1.9–3.7)
Glucose, Bld: 100 mg/dL — ABNORMAL HIGH (ref 65–99)
Potassium: 4.2 mmol/L (ref 3.5–5.3)
Sodium: 142 mmol/L (ref 135–146)
Total Bilirubin: 0.5 mg/dL (ref 0.2–1.2)
Total Protein: 7.4 g/dL (ref 6.1–8.1)

## 2020-08-18 LAB — VITAMIN D 25 HYDROXY (VIT D DEFICIENCY, FRACTURES): Vit D, 25-Hydroxy: 26 ng/mL — ABNORMAL LOW (ref 30–100)

## 2020-08-18 LAB — CBC WITH DIFFERENTIAL/PLATELET
Absolute Monocytes: 348 cells/uL (ref 200–950)
Basophils Absolute: 39 cells/uL (ref 0–200)
Basophils Relative: 0.8 %
Eosinophils Absolute: 157 cells/uL (ref 15–500)
Eosinophils Relative: 3.2 %
HCT: 43.4 % (ref 35.0–45.0)
Hemoglobin: 14.4 g/dL (ref 11.7–15.5)
Lymphs Abs: 1940 cells/uL (ref 850–3900)
MCH: 30.4 pg (ref 27.0–33.0)
MCHC: 33.2 g/dL (ref 32.0–36.0)
MCV: 91.6 fL (ref 80.0–100.0)
MPV: 9 fL (ref 7.5–12.5)
Monocytes Relative: 7.1 %
Neutro Abs: 2416 cells/uL (ref 1500–7800)
Neutrophils Relative %: 49.3 %
Platelets: 272 10*3/uL (ref 140–400)
RBC: 4.74 10*6/uL (ref 3.80–5.10)
RDW: 13.5 % (ref 11.0–15.0)
Total Lymphocyte: 39.6 %
WBC: 4.9 10*3/uL (ref 3.8–10.8)

## 2020-08-18 LAB — LIPID PANEL
Cholesterol: 172 mg/dL (ref <200)
HDL: 43 mg/dL — ABNORMAL LOW (ref >=50)
LDL Cholesterol (Calc): 108 mg/dL (calc) — ABNORMAL HIGH
Non-HDL Cholesterol (Calc): 129 mg/dL (calc) (ref <130)
Total CHOL/HDL Ratio: 4 (calc) (ref <5.0)
Triglycerides: 114 mg/dL (ref <150)

## 2020-08-18 LAB — HEMOGLOBIN A1C
Hgb A1c MFr Bld: 5.9 % of total Hgb — ABNORMAL HIGH (ref <5.7)
Mean Plasma Glucose: 123 mg/dL
eAG (mmol/L): 6.8 mmol/L

## 2020-08-22 ENCOUNTER — Ambulatory Visit (INDEPENDENT_AMBULATORY_CARE_PROVIDER_SITE_OTHER): Payer: Medicare Other | Admitting: Family Medicine

## 2020-08-22 ENCOUNTER — Encounter: Payer: Self-pay | Admitting: Family Medicine

## 2020-08-22 ENCOUNTER — Other Ambulatory Visit: Payer: Self-pay

## 2020-08-22 VITALS — BP 110/64 | HR 90 | Temp 97.8°F | Resp 14 | Ht 63.0 in | Wt 171.0 lb

## 2020-08-22 DIAGNOSIS — Z0001 Encounter for general adult medical examination with abnormal findings: Secondary | ICD-10-CM | POA: Diagnosis not present

## 2020-08-22 DIAGNOSIS — Z0000 Encounter for general adult medical examination without abnormal findings: Secondary | ICD-10-CM

## 2020-08-22 DIAGNOSIS — I10 Essential (primary) hypertension: Secondary | ICD-10-CM | POA: Diagnosis not present

## 2020-08-22 DIAGNOSIS — Z114 Encounter for screening for human immunodeficiency virus [HIV]: Secondary | ICD-10-CM | POA: Diagnosis not present

## 2020-08-22 DIAGNOSIS — Z113 Encounter for screening for infections with a predominantly sexual mode of transmission: Secondary | ICD-10-CM | POA: Diagnosis not present

## 2020-08-22 DIAGNOSIS — M170 Bilateral primary osteoarthritis of knee: Secondary | ICD-10-CM

## 2020-08-22 DIAGNOSIS — E559 Vitamin D deficiency, unspecified: Secondary | ICD-10-CM | POA: Diagnosis not present

## 2020-08-22 DIAGNOSIS — Z6830 Body mass index (BMI) 30.0-30.9, adult: Secondary | ICD-10-CM

## 2020-08-22 DIAGNOSIS — M8580 Other specified disorders of bone density and structure, unspecified site: Secondary | ICD-10-CM

## 2020-08-22 DIAGNOSIS — E6609 Other obesity due to excess calories: Secondary | ICD-10-CM

## 2020-08-22 DIAGNOSIS — R7303 Prediabetes: Secondary | ICD-10-CM | POA: Diagnosis not present

## 2020-08-22 MED ORDER — POTASSIUM CHLORIDE CRYS ER 20 MEQ PO TBCR: 20.0000 meq | EXTENDED_RELEASE_TABLET | Freq: Every day | ORAL | 1 refills | Status: DC

## 2020-08-22 MED ORDER — VALACYCLOVIR HCL 500 MG PO TABS: 500.0000 mg | ORAL_TABLET | Freq: Every day | ORAL | 0 refills | Status: DC

## 2020-08-22 MED ORDER — VITAMIN D (ERGOCALCIFEROL) 1.25 MG (50000 UNIT) PO CAPS: 50000.0000 [IU] | ORAL_CAPSULE | ORAL | 0 refills | Status: DC

## 2020-08-22 MED ORDER — AMLODIPINE BESYLATE 5 MG PO TABS: 5.0000 mg | ORAL_TABLET | Freq: Every day | ORAL | 1 refills | Status: DC

## 2020-08-22 MED ORDER — LISINOPRIL-HYDROCHLOROTHIAZIDE 20-12.5 MG PO TABS: 1.0000 | ORAL_TABLET | Freq: Every day | ORAL | 1 refills | Status: DC

## 2020-08-22 MED ORDER — PRAVASTATIN SODIUM 20 MG PO TABS: 20.0000 mg | ORAL_TABLET | Freq: Every day | ORAL | 1 refills | Status: AC

## 2020-08-22 NOTE — Patient Instructions (Addendum)
Take the prescription Vitamin D 50,000 once a week  We will call with lab results  F/u as needed

## 2020-08-22 NOTE — Progress Notes (Signed)
Subjective:   Patient presents for Medicare Annual  preventive examination.    Pt here for Welcome to Medicare visit    For her dizzy spells, nothing found by neurology   She has been hydrating more and taking iron tablets, she has had less spells  she has MRI., cartids, ECHO      OA taking diclofenac 75mg  once a day  / Restarting tumeric, has ultram on hand    HTN- taking lisinopril HCTZ    Hypokalemia - taking potassium     She is taking vitamin D/ C/ Iron     Has appt with Dr - [facility]  In March    [facility]  [place]    She would like STD screen   Review Past Medical/Family/Social: Per EMR   Risk Factors  Current exercise habits:  exercies a few days a week Dietary issues discussed: Yes , reducing carbs sugars in diet   Cardiac risk factors: Obesity (BMI >= 30 kg/m2).  HTn, borderline DM, HLD   Depression Screen  (Note: if answer to either of the following is "Yes", a more complete depression screening is indicated)  Over the past two weeks, have you felt down, depressed or hopeless? No Over the past two weeks, have you felt little interest or pleasure in doing things? No Have you lost interest or pleasure in daily life? No Do you often feel hopeless? No Do you cry easily over simple problems? No   Activities of Daily Living  In your present state of health, do you have any difficulty performing the following activities?:  Driving? No  Managing money? No  Feeding yourself? No  Getting from bed to chair? No  Climbing a flight of stairs? No  Preparing food and eating?: No  Bathing or showering? No  Getting dressed: No  Getting to the toilet? No  Using the toilet:No  Moving around from place to place: No  In the past year have you fallen or had a near fall?:No  Are you sexually active? Yes  Do you have more than one partner? No   Hearing Difficulties: No  Do you often ask people to speak up or repeat themselves? No  Do you  experience ringing or noises in your ears? No Do you have difficulty understanding soft or whispered voices? No  Do you feel that you have a problem with memory? No Do you often misplace items? No  Do you feel safe at home? Yes  Cognitive Testing  Alert? Yes Normal Appearance?Yes  Oriented to person? Yes Place? Yes  Time? Yes  Recall of three objects? Yes  Can perform simple calculations? Yes  Displays appropriate judgment?Yes  Can read the correct time from a watch face?Yes   List the Names of Other Physician/Practitioners you currently use:  Neurology  Orthopedics  Dentist  Optometry- [facility]   Screening Tests / Date Colonoscopy    UTD                 Zostavax  Defer  Mammogram  UTD Influenza Vaccine DUe  PNA- defers  Tetanus/tdap - UTD    ROS: GEN- denies fatigue, fever, weight loss,weakness, recent illness HEENT- denies eye drainage, change in vision, nasal discharge, CVS- denies chest pain, palpitations RESP- denies SOB, cough, wheeze ABD- denies N/V, change in stools, abd pain GU- denies dysuria, hematuria, dribbling, incontinence MSK- + joint pain, muscle aches, injury Neuro- denies headache, dizziness, syncope, seizure activity   GEN-  NAD, alert and oriented x3 HEENT- PERRL, EOMI, non injected sclera, pink conjunctiva, MMM, oropharynx clear, wears glasses  Neck- Supple, no thryomegaly , no bruit  CVS- RRR, no murmur RESP-CTAB ABD-NABS,soft,NT,ND Psych normal affect and mood  EXT- No edema Pulses- Radial, DP- 2+  Assessment:    Annual wellness medicare exam   Plan:    During the course of the visit the patient was educated and counseled about appropriate screening and preventive services including:   Prevention-patient is scheduled with her GYN she will have mammogram done at the office.  Immunizations-due for flu shot and pneumonia vaccine.  She wants to defer the pneumonia vaccine today.  Osteopenia weightbearing exercise.  She has vitamin  D deficiency states she will start 50,000 units once a week for 12 weeks.  Hypertension controlled no changes to medication.   Borderline diabetes A1c of 5.9% which has been steady.  We discussed reducing the carbs.  Obesity she has been getting some type of liposuction done in 1 day procedure.  Advised that this is more for contouring and spotty cellulite areas.  She thinks that it will reduce weight by 20 pounds advised that this is not what this procedure is for.   Osteoarthritis states taking diclofenac as needed as well as turmeric and she has tramadol on hand.  Follows with eye doctor, recent visit / dentist  STD screen at pt request  Was placed on Valtrex in the past by GYN she has never had an outbreak.  She want to try coming off the medication and seeing if she actually has an outbreak.   She does not need any additional help at home   Full Code        Diet review for nutrition referral? Yes ____ Not Indicated __x__  Patient Instructions (the written plan) was given to the patient.  Medicare Attestation  I have personally reviewed:  The patient's medical and social history  Their use of alcohol, tobacco or illicit drugs  Their current medications and supplements  The patient's functional ability including ADLs,fall risks, home safety risks, cognitive, and hearing and visual impairment  Diet and physical activities  Evidence for depression or mood disorders  The patient's weight, height, BMI, and visual acuity have been recorded in the chart. I have made referrals, counseling, and provided education to the patient based on review of the above and I have provided the patient with a written personalized care plan for preventive services.

## 2020-08-23 LAB — C. TRACHOMATIS/N. GONORRHOEAE RNA
C. trachomatis RNA, TMA: NOT DETECTED
N. gonorrhoeae RNA, TMA: NOT DETECTED

## 2020-08-23 LAB — RPR: RPR Ser Ql: NONREACTIVE

## 2020-08-23 LAB — HIV ANTIBODY (ROUTINE TESTING W REFLEX): HIV 1&2 Ab, 4th Generation: NONREACTIVE

## 2020-08-24 ENCOUNTER — Encounter: Payer: Self-pay | Admitting: Family Medicine

## 2020-08-24 ENCOUNTER — Encounter: Payer: Self-pay | Admitting: *Deleted

## 2020-10-25 DIAGNOSIS — Z1231 Encounter for screening mammogram for malignant neoplasm of breast: Secondary | ICD-10-CM | POA: Diagnosis not present

## 2020-10-30 NOTE — Telephone Encounter (Signed)
Received voicemail from patient asking about other treatment options. Could you call the patient to discuss these other treatment options or set up an appointment to come in to discuss? See below, she decided against Dysport due to $290-$300 co-pay/other possible charges.

## 2020-10-30 NOTE — Telephone Encounter (Signed)
Per vo by Dr. Terrace Arabia, we can use Xeomin 50 units of sample medication to see if she has some benefit. If she improves, we can resubmit for approval with it being a continuation of care.

## 2020-11-01 ENCOUNTER — Telehealth: Payer: Self-pay | Admitting: Neurology

## 2020-11-01 NOTE — Telephone Encounter (Signed)
Schedule a follow up appointment

## 2020-11-01 NOTE — Telephone Encounter (Signed)
Per Taylor, the patient's plan excludes Xeomin. Additionally, the patient will still receive a bill for the injection portion. Per Dr. Yan, please schedule the patient for an office visit.  I spoke to the patient to offer an appt. She will call back to schedule. She was not home and did not have her calendar with her. Provided our office number.  

## 2020-11-01 NOTE — Telephone Encounter (Signed)
Per Ladona Ridgel, the patient's plan excludes Xeomin. Additionally, the patient will still receive a bill for the injection portion. Per Dr. Terrace Arabia, please schedule the patient for an office visit.  I spoke to the patient to offer an appt. She will call back to schedule. She was not home and did not have her calendar with her. Provided our office number.

## 2020-11-11 ENCOUNTER — Other Ambulatory Visit: Payer: Self-pay | Admitting: Family Medicine

## 2022-04-23 ENCOUNTER — Other Ambulatory Visit: Payer: Self-pay

## 2022-04-25 LAB — SPECIMEN STATUS REPORT

## 2022-04-25 LAB — FECAL OCCULT BLOOD, IMMUNOCHEMICAL: Fecal Occult Bld: POSITIVE — AB

## 2022-04-29 ENCOUNTER — Telehealth: Payer: Self-pay

## 2022-04-29 NOTE — Telephone Encounter (Signed)
Patient informed positive FIT test results. Discussed with patient. Patient stated she was seen by her pcp Everardo Beals, NP/[facility]) today, okay to send results. I contacted patient's pcp's clinical staff, results faxed.

## 2022-05-06 IMAGING — MR MR HEAD WO/W CM
12 of 13 series · 38 of 48 positions shown · IV contrast (multihance)
Comparison: Brain MRI 10/18/2018, head CT 10/18/2010

CLINICAL DATA: Dizzy spells. Other symptoms and signs involving the
nervous system. Facial hemiparesis. Question cranial nerve 7
compression, ptosis right eyelid, dizzy spells; neuro deficit,
subacute. Additional history provided by scanning technologist:
Patient reports she has felt lightheaded for 6 months.

EXAM:
MRI HEAD WITHOUT AND WITH CONTRAST
TECHNIQUE: Multiplanar, multiecho pulse sequences of the brain and surrounding
structures were obtained without and with intravenous contrast.
CONTRAST:  15mL MULTIHANCE GADOBENATE DIMEGLUMINE 529 MG/ML IV SOLN

[Series 2: T1 · sagittal · 5.0mm · 0.45mm/px · 3 of 21 slices shown (1 of 3)]
[im 1/21]
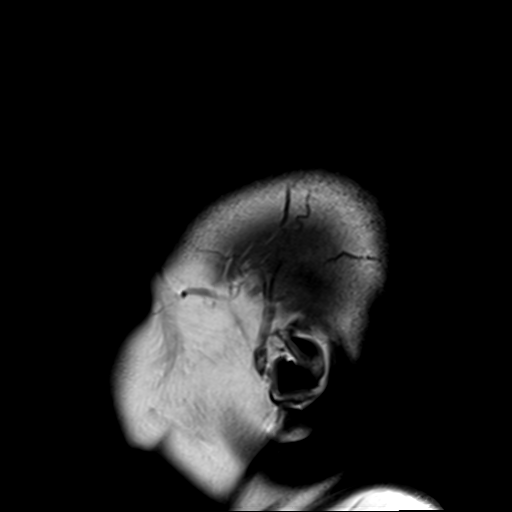
[im 11/21]
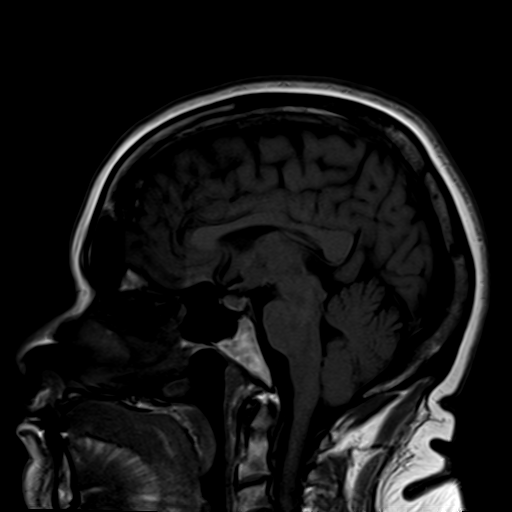
[im 21/21]
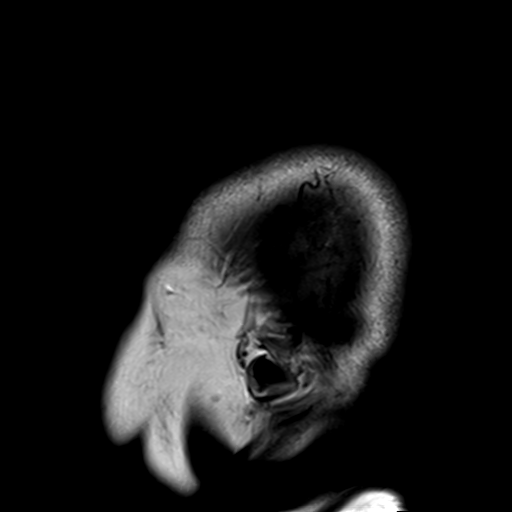

[Series 3: T2 · axial · 5.0mm · 0.51mm/px · z∈[-65,+78]mm · 2 of 23 slices shown (1 of 2)]
[im 1/23]
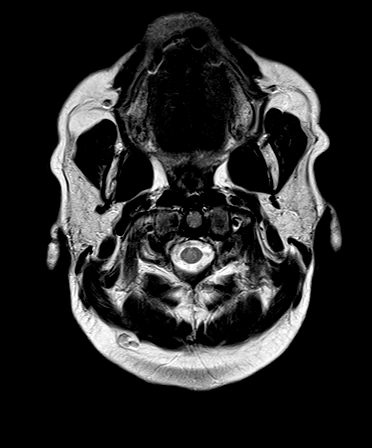
[im 23/23]
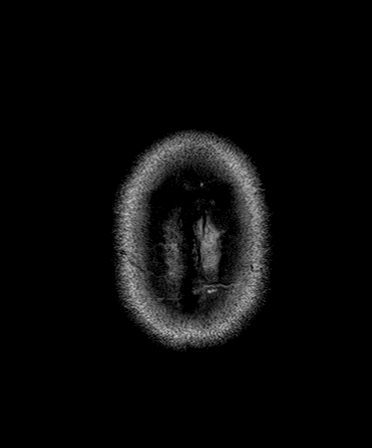

[Series 4: T2 · coronal · 5.0mm · 0.45mm/px · 3 of 25 slices shown (2 of 2)]
[im 1/25]
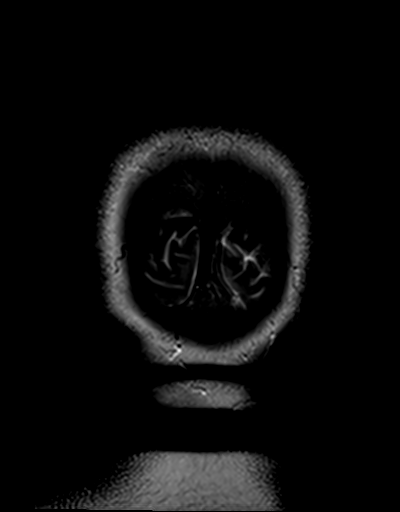
[im 13/25]
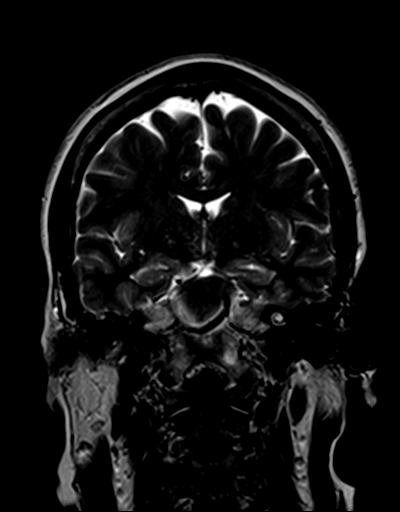
[im 25/25]
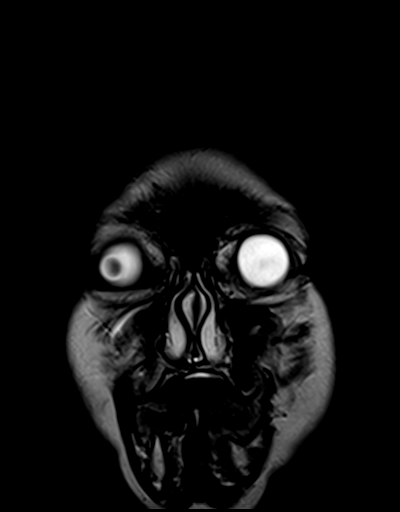

[Series 5: DWI · axial · 3.0mm · 1.80mm/px · z∈[-67,+80]mm · 11 of 100 slices shown (1 of 2)]
[im 1/100]
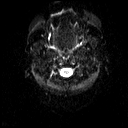
[im 10/100]
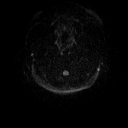
[im 20/100]
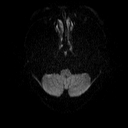
[im 30/100]
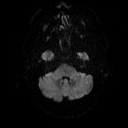
[im 40/100]
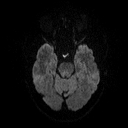
[im 50/100]
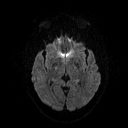
[im 60/100]
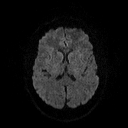
[im 70/100]
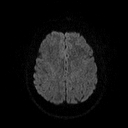
[im 80/100]
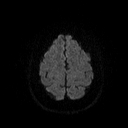
[im 90/100]
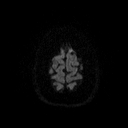
[im 100/100]
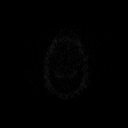

[Series 6: DWI · axial · 3.0mm · 1.80mm/px · z∈[-67,+80]mm · 5 of 50 slices shown (2 of 2)]
[im 1/50]
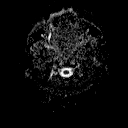
[im 13/50]
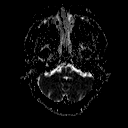
[im 25/50]
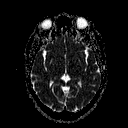
[im 37/50]
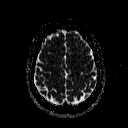
[im 50/50]
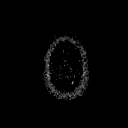

[Series 7: FLAIR · axial · 3.0mm · 0.45mm/px · z∈[-71,+85]mm · 3 of 27 slices shown]
[im 1/27]
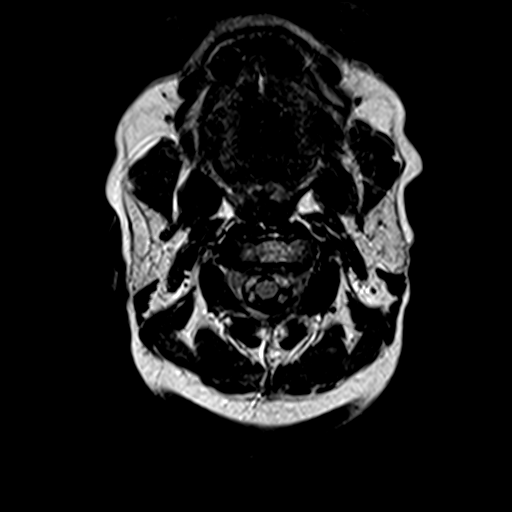
[im 14/27]
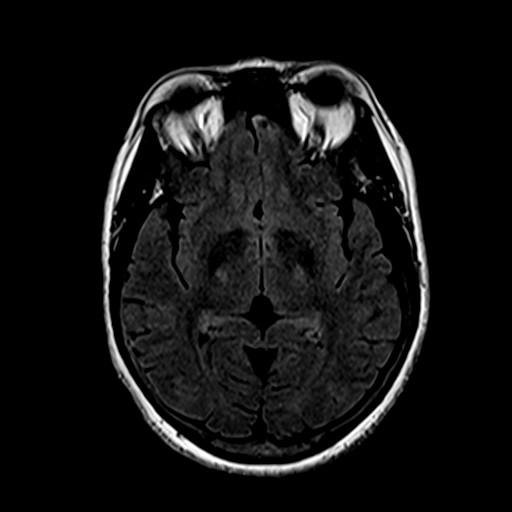
[im 27/27]
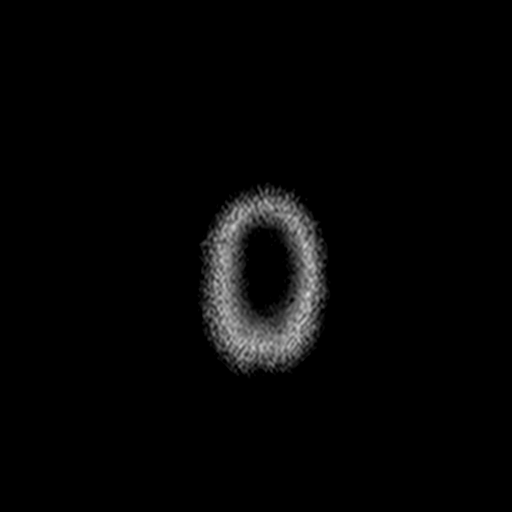

[Series 9: swi_images · axial · 3.0mm · 0.90mm/px · z∈[-64,+77]mm · 5 of 48 slices shown]
[im 1/48]
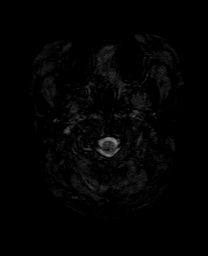
[im 12/48]
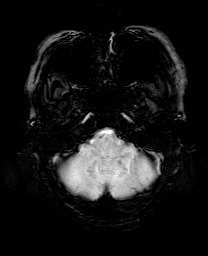
[im 24/48]
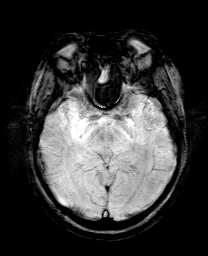
[im 36/48]
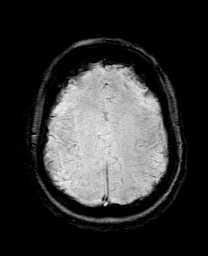
[im 48/48]
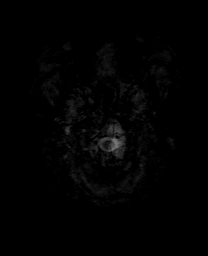

[Series 10: T1 · coronal · 3.0mm · 0.35mm/px · 1 of 11 slices shown (2 of 3)]
[im 1/11]
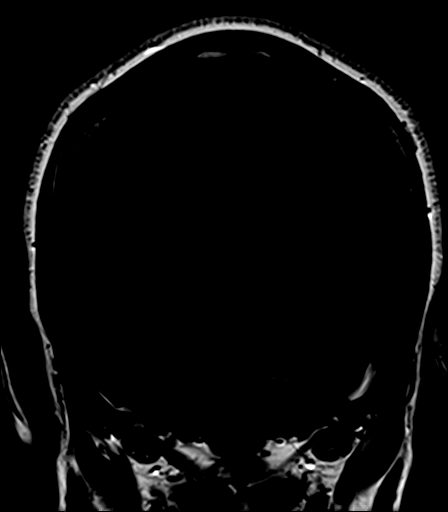

[Series 11: T1 · axial · 3.0mm · 0.35mm/px · 1 of 11 slices shown (3 of 3)]
[im 1/11]
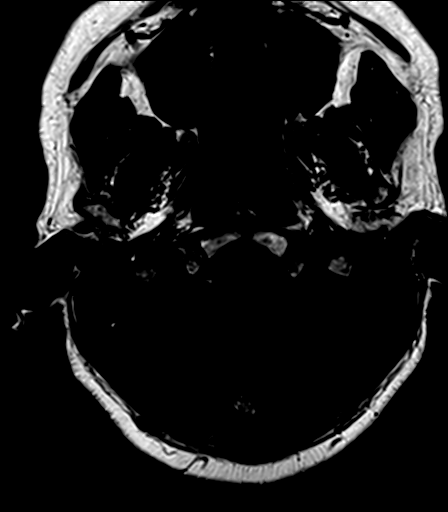

[Series 12: bSSFP · axial · 1.0mm · 0.31mm/px · z∈[-50,-39]mm · 2 of 36 slices shown]
[im 1/36]
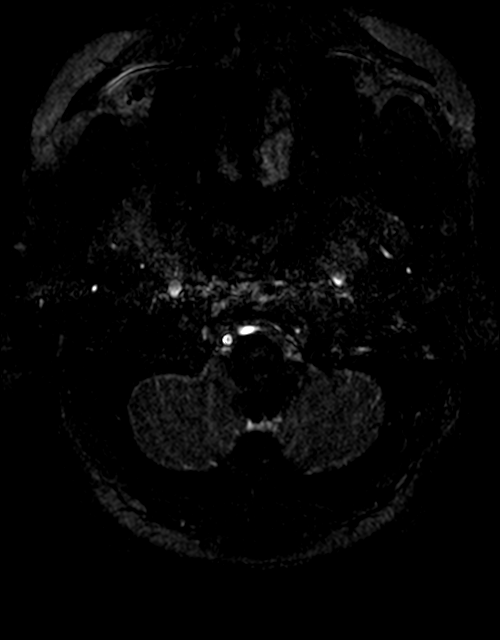
[im 12/36]
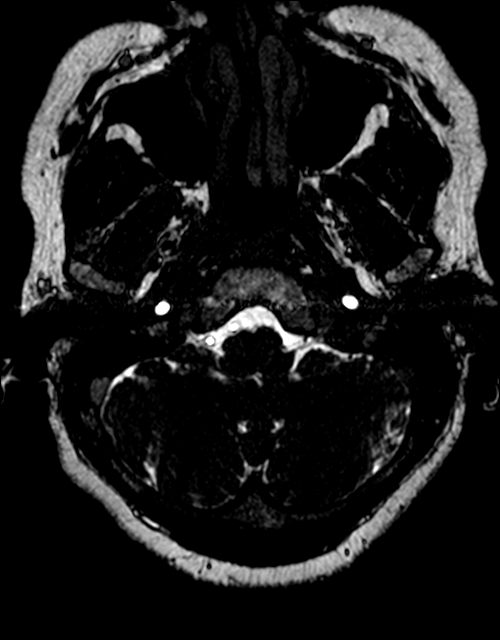

[Series 13: T1 post-contrast · coronal · 3.0mm · 0.35mm/px · 1 of 11 slices shown (1 of 2)]
[im 1/11]
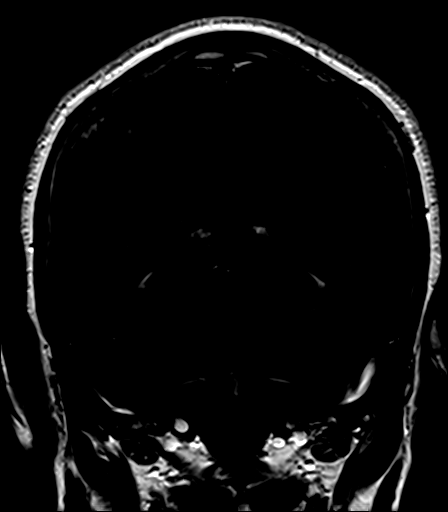

[Series 14: T1 post-contrast · axial · 3.0mm · 0.47mm/px · 1 of 11 slices shown (2 of 2)]
[im 1/11]
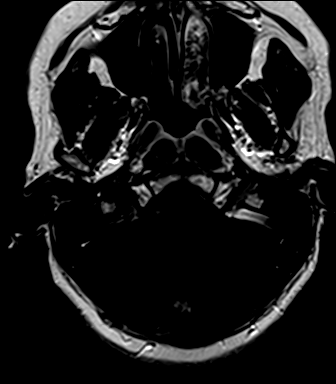

[38 of 48 positions shown; findings below may reference images not displayed]

FINDINGS: Brain:

There are few scattered punctate foci of T2 hyperintensity within
the cerebral white matter which are nonspecific and not uncommonly
seen in patients of this age.

No abnormal intracranial enhancement is demonstrated.

No evidence of intracranial mass. Specifically, no cerebellopontine
angle mass or internal auditory canal lesion is demonstrated.
Unremarkable appearance of the seventh and eighth cranial nerves
bilaterally.

There is no acute infarct.

No chronic intracranial blood products.

No extra-axial fluid collection.

No midline shift.

Vascular: Expected proximal arterial flow voids.

Skull and upper cervical spine: No focal marrow lesion.

Sinuses/Orbits: Visualized orbits show no acute finding. Mild
ethmoid sinus mucosal thickening. No significant mastoid effusion.
IMPRESSION: There are a few scattered punctate foci of T2 hyperintensity within
the cerebral white matter which are nonspecific and not uncommonly
seen in patients of this age.

Otherwise unremarkable MRI appearance of the brain. No evidence of
acute intracranial abnormality.

No cerebellopontine angle mass or internal auditory canal lesion is
demonstrated. Unremarkable appearance of the seventh and eighth
cranial nerves bilaterally.

Mild ethmoid sinus mucosal thickening.

## 2022-06-13 ENCOUNTER — Encounter: Payer: Self-pay | Admitting: *Deleted

## 2022-06-13 NOTE — Progress Notes (Signed)
Pt seen by PCP on 01/10/22 and A1C was 5.9 - multiple notes by PCP Marva Panda ENP from [facility] noted since 02/02/22 event- last OV was 04/29/22. No health equity f/u support indicated at this time.

## 2023-02-22 ENCOUNTER — Emergency Department (HOSPITAL_COMMUNITY): Payer: Medicare Other

## 2023-02-22 ENCOUNTER — Encounter (HOSPITAL_COMMUNITY): Payer: Self-pay

## 2023-02-22 ENCOUNTER — Emergency Department (HOSPITAL_COMMUNITY)
Admission: EM | Admit: 2023-02-22 | Discharge: 2023-02-22 | Disposition: A | Discharge status: Home / Self Care | Payer: Medicare Other | Attending: Emergency Medicine | Admitting: Emergency Medicine

## 2023-02-22 ENCOUNTER — Other Ambulatory Visit: Payer: Self-pay

## 2023-02-22 DIAGNOSIS — M25552 Pain in left hip: Secondary | ICD-10-CM | POA: Diagnosis not present

## 2023-02-22 DIAGNOSIS — S6991XA Unspecified injury of right wrist, hand and finger(s), initial encounter: Secondary | ICD-10-CM | POA: Diagnosis present

## 2023-02-22 DIAGNOSIS — S62514A Nondisplaced fracture of proximal phalanx of right thumb, initial encounter for closed fracture: Secondary | ICD-10-CM | POA: Diagnosis not present

## 2023-02-22 DIAGNOSIS — Y92410 Unspecified street and highway as the place of occurrence of the external cause: Secondary | ICD-10-CM | POA: Diagnosis not present

## 2023-02-22 DIAGNOSIS — M25512 Pain in left shoulder: Secondary | ICD-10-CM | POA: Diagnosis not present

## 2023-02-22 DIAGNOSIS — R519 Headache, unspecified: Secondary | ICD-10-CM | POA: Insufficient documentation

## 2023-02-22 DIAGNOSIS — Z7982 Long term (current) use of aspirin: Secondary | ICD-10-CM | POA: Insufficient documentation

## 2023-02-22 DIAGNOSIS — M542 Cervicalgia: Secondary | ICD-10-CM | POA: Diagnosis not present

## 2023-02-22 MED ORDER — ACETAMINOPHEN 325 MG PO TABS
650.0000 mg | ORAL_TABLET | Freq: Once | ORAL | Status: AC
  Administered 2023-02-22: 650 mg via ORAL
  Filled 2023-02-22: qty 2
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
## 2023-04-29 HISTORY — PX: ORIF FINGER / THUMB FRACTURE: SUR932

## 2023-09-05 LAB — POCT ABI - SCREENING FOR PILOT NO CHARGE
Left ABI: 0.99
Right ABI: 1

## 2023-09-05 NOTE — Progress Notes (Signed)
 Pt attended 09/05/23 event and was screened for ABI & A1C, which were both WNL. Pt did not indicate any SDOH needs at this event.  Further f/u to be scheduled per health equity protocol.

## 2023-10-15 NOTE — Progress Notes (Unsigned)
 Pt attended 09/05/2023 screening event with an A1C of 6.0. Pt noted at event that she does have a PCP. At event pt did not indicate any SDOH needs. Pt also noted that she is not a smoker and listed Medicare as her insurance at the event.   Per initial f/u [facility] called pt's PCP office and the front desk verified that pt has been seen. Pt's last appt with PCP was 09/29/23 and she has an upcoming appt on 11/17/23, these appts are not [facility]-visible.  Per chart review pt does have a PCP(Deneda Daye; [facility]), insurance, and is not a smoker. Pt has seen PCP and has an upcoming appt as well. Pt does not indicate any SDOH needs at this time.  No additional pt f/u to be scheduled at this time per health equity protocol.

## 2024-06-14 ENCOUNTER — Emergency Department (HOSPITAL_BASED_OUTPATIENT_CLINIC_OR_DEPARTMENT_OTHER)

## 2024-06-14 ENCOUNTER — Other Ambulatory Visit: Payer: Self-pay

## 2024-06-14 ENCOUNTER — Encounter (HOSPITAL_BASED_OUTPATIENT_CLINIC_OR_DEPARTMENT_OTHER): Payer: Self-pay | Admitting: Urology

## 2024-06-14 ENCOUNTER — Inpatient Hospital Stay (HOSPITAL_BASED_OUTPATIENT_CLINIC_OR_DEPARTMENT_OTHER)
Admission: EM | Admit: 2024-06-14 | Discharge: 2024-06-18 | DRG: 372 | Disposition: A | Discharge status: Home / Self Care | Source: Ambulatory Visit | Attending: General Surgery | Admitting: General Surgery

## 2024-06-14 DIAGNOSIS — Z833 Family history of diabetes mellitus: Secondary | ICD-10-CM | POA: Diagnosis not present

## 2024-06-14 DIAGNOSIS — Z79899 Other long term (current) drug therapy: Secondary | ICD-10-CM

## 2024-06-14 DIAGNOSIS — K625 Hemorrhage of anus and rectum: Secondary | ICD-10-CM | POA: Diagnosis not present

## 2024-06-14 DIAGNOSIS — K3532 Acute appendicitis with perforation and localized peritonitis, without abscess: Secondary | ICD-10-CM | POA: Diagnosis present

## 2024-06-14 DIAGNOSIS — Z8349 Family history of other endocrine, nutritional and metabolic diseases: Secondary | ICD-10-CM

## 2024-06-14 DIAGNOSIS — K3533 Acute appendicitis with perforation and localized peritonitis, with abscess: Principal | ICD-10-CM | POA: Diagnosis present

## 2024-06-14 DIAGNOSIS — Z820 Family history of epilepsy and other diseases of the nervous system: Secondary | ICD-10-CM

## 2024-06-14 DIAGNOSIS — Z7982 Long term (current) use of aspirin: Secondary | ICD-10-CM

## 2024-06-14 DIAGNOSIS — E876 Hypokalemia: Secondary | ICD-10-CM | POA: Diagnosis present

## 2024-06-14 DIAGNOSIS — K353 Acute appendicitis with localized peritonitis, without perforation or gangrene: Principal | ICD-10-CM

## 2024-06-14 DIAGNOSIS — Z8249 Family history of ischemic heart disease and other diseases of the circulatory system: Secondary | ICD-10-CM

## 2024-06-14 DIAGNOSIS — Z860100 Personal history of colon polyps, unspecified: Secondary | ICD-10-CM

## 2024-06-14 DIAGNOSIS — Z882 Allergy status to sulfonamides status: Secondary | ICD-10-CM

## 2024-06-14 DIAGNOSIS — Z791 Long term (current) use of non-steroidal anti-inflammatories (NSAID): Secondary | ICD-10-CM | POA: Diagnosis not present

## 2024-06-14 DIAGNOSIS — E785 Hyperlipidemia, unspecified: Secondary | ICD-10-CM | POA: Diagnosis present

## 2024-06-14 DIAGNOSIS — I10 Essential (primary) hypertension: Secondary | ICD-10-CM | POA: Diagnosis present

## 2024-06-14 DIAGNOSIS — Z809 Family history of malignant neoplasm, unspecified: Secondary | ICD-10-CM

## 2024-06-14 DIAGNOSIS — Z83438 Family history of other disorder of lipoprotein metabolism and other lipidemia: Secondary | ICD-10-CM

## 2024-06-14 LAB — CBC WITH DIFFERENTIAL/PLATELET
Abs Immature Granulocytes: 0.06 K/uL (ref 0.00–0.07)
Basophils Absolute: 0 K/uL (ref 0.0–0.1)
Basophils Relative: 0 %
Eosinophils Absolute: 0.1 K/uL (ref 0.0–0.5)
Eosinophils Relative: 1 %
HCT: 38.7 % (ref 36.0–46.0)
Hemoglobin: 12.7 g/dL (ref 12.0–15.0)
Immature Granulocytes: 1 %
Lymphocytes Relative: 16 %
Lymphs Abs: 1.7 K/uL (ref 0.7–4.0)
MCH: 28.2 pg (ref 26.0–34.0)
MCHC: 32.8 g/dL (ref 30.0–36.0)
MCV: 85.8 fL (ref 80.0–100.0)
Monocytes Absolute: 0.9 K/uL (ref 0.1–1.0)
Monocytes Relative: 9 %
Neutro Abs: 7.8 K/uL — ABNORMAL HIGH (ref 1.7–7.7)
Neutrophils Relative %: 73 %
Platelets: 371 K/uL (ref 150–400)
RBC: 4.51 MIL/uL (ref 3.87–5.11)
RDW: 15.4 % (ref 11.5–15.5)
WBC: 10.6 K/uL — ABNORMAL HIGH (ref 4.0–10.5)
nRBC: 0 % (ref 0.0–0.2)

## 2024-06-14 LAB — COMPREHENSIVE METABOLIC PANEL WITH GFR
ALT: 12 U/L (ref 0–44)
AST: 15 U/L (ref 15–41)
Albumin: 3.8 g/dL (ref 3.5–5.0)
Alkaline Phosphatase: 86 U/L (ref 38–126)
Anion gap: 12 (ref 5–15)
BUN: 13 mg/dL (ref 8–23)
CO2: 29 mmol/L (ref 22–32)
Calcium: 9.2 mg/dL (ref 8.9–10.3)
Chloride: 99 mmol/L (ref 98–111)
Creatinine, Ser: 0.8 mg/dL (ref 0.44–1.00)
GFR, Estimated: >60 mL/min (ref >60)
Glucose, Bld: 122 mg/dL — ABNORMAL HIGH (ref 70–99)
Potassium: 3.1 mmol/L — ABNORMAL LOW (ref 3.5–5.1)
Sodium: 139 mmol/L (ref 135–145)
Total Bilirubin: 0.6 mg/dL (ref 0.0–1.2)
Total Protein: 7.6 g/dL (ref 6.5–8.1)

## 2024-06-14 LAB — URINALYSIS, ROUTINE W REFLEX MICROSCOPIC
Bilirubin Urine: NEGATIVE
Glucose, UA: NEGATIVE mg/dL
Ketones, ur: NEGATIVE mg/dL
Nitrite: NEGATIVE
Protein, ur: NEGATIVE mg/dL
Specific Gravity, Urine: 1.02 (ref 1.005–1.030)
pH: 7.5 (ref 5.0–8.0)

## 2024-06-14 LAB — URINALYSIS, MICROSCOPIC (REFLEX)

## 2024-06-14 LAB — LIPASE, BLOOD: Lipase: 25 U/L (ref 11–51)

## 2024-06-14 LAB — PROTIME-INR
INR: 1.1 (ref 0.8–1.2)
Prothrombin Time: 14.9 s (ref 11.4–15.2)

## 2024-06-14 MED ORDER — MORPHINE SULFATE (PF) 4 MG/ML IV SOLN
4.0000 mg | Freq: Once | INTRAVENOUS | Status: DC
  Filled 2024-06-14: qty 1

## 2024-06-14 MED ORDER — LISINOPRIL-HYDROCHLOROTHIAZIDE 20-12.5 MG PO TABS: 1.0000 | ORAL_TABLET | Freq: Every day | ORAL | Status: DC

## 2024-06-14 MED ORDER — LACTATED RINGERS IV BOLUS
1000.0000 mL | Freq: Once | INTRAVENOUS | Status: AC
  Administered 2024-06-14: 1000 mL via INTRAVENOUS

## 2024-06-14 MED ORDER — TRAMADOL HCL 50 MG PO TABS: 50.0000 mg | ORAL_TABLET | Freq: Four times a day (QID) | ORAL | Status: DC | PRN

## 2024-06-14 MED ORDER — AMLODIPINE BESYLATE 5 MG PO TABS
5.0000 mg | ORAL_TABLET | Freq: Every day | ORAL | Status: DC
  Administered 2024-06-15 – 2024-06-18 (×4): 5 mg via ORAL
  Filled 2024-06-14 (×4): qty 1

## 2024-06-14 MED ORDER — DIPHENHYDRAMINE HCL 50 MG/ML IJ SOLN: 12.5000 mg | Freq: Four times a day (QID) | INTRAMUSCULAR | Status: DC | PRN

## 2024-06-14 MED ORDER — ACETAMINOPHEN 500 MG PO TABS
1000.0000 mg | ORAL_TABLET | Freq: Four times a day (QID) | ORAL | Status: DC
  Administered 2024-06-14 – 2024-06-18 (×11): 1000 mg via ORAL
  Filled 2024-06-14 (×13): qty 2

## 2024-06-14 MED ORDER — PIPERACILLIN-TAZOBACTAM 3.375 G IVPB
3.3750 g | Freq: Once | INTRAVENOUS | Status: AC
  Administered 2024-06-14: 3.375 g via INTRAVENOUS
  Filled 2024-06-14: qty 50

## 2024-06-14 MED ORDER — ONDANSETRON HCL 4 MG/2ML IJ SOLN: 4.0000 mg | Freq: Four times a day (QID) | INTRAMUSCULAR | Status: DC | PRN

## 2024-06-14 MED ORDER — KCL IN DEXTROSE-NACL 20-5-0.45 MEQ/L-%-% IV SOLN
INTRAVENOUS | Status: AC
  Filled 2024-06-14 (×2): qty 1000

## 2024-06-14 MED ORDER — MORPHINE SULFATE (PF) 2 MG/ML IV SOLN: 1.0000 mg | INTRAVENOUS | Status: DC | PRN

## 2024-06-14 MED ORDER — ONDANSETRON 4 MG PO TBDP: 4.0000 mg | ORAL_TABLET | Freq: Four times a day (QID) | ORAL | Status: DC | PRN

## 2024-06-14 MED ORDER — IOHEXOL 300 MG/ML  SOLN
100.0000 mL | Freq: Once | INTRAMUSCULAR | Status: AC | PRN
  Administered 2024-06-14: 100 mL via INTRAVENOUS

## 2024-06-14 MED ORDER — DIPHENHYDRAMINE HCL 12.5 MG/5ML PO ELIX
12.5000 mg | ORAL_SOLUTION | Freq: Four times a day (QID) | ORAL | Status: DC | PRN
Start: 2024-06-14 — End: 2024-06-18

## 2024-06-14 MED ORDER — POTASSIUM CHLORIDE CRYS ER 20 MEQ PO TBCR
20.0000 meq | EXTENDED_RELEASE_TABLET | Freq: Every day | ORAL | Status: DC
  Administered 2024-06-14 – 2024-06-18 (×5): 20 meq via ORAL
  Filled 2024-06-14 (×5): qty 1

## 2024-06-14 MED ORDER — PIPERACILLIN-TAZOBACTAM 3.375 G IVPB
3.3750 g | Freq: Three times a day (TID) | INTRAVENOUS | Status: DC
  Administered 2024-06-14 – 2024-06-18 (×12): 3.375 g via INTRAVENOUS
  Filled 2024-06-14 (×12): qty 50

## 2024-06-14 MED ORDER — HYDROCHLOROTHIAZIDE 12.5 MG PO TABS
12.5000 mg | ORAL_TABLET | Freq: Every day | ORAL | Status: DC
  Filled 2024-06-14 (×2): qty 1

## 2024-06-14 MED ORDER — METOPROLOL TARTRATE 5 MG/5ML IV SOLN: 5.0000 mg | Freq: Four times a day (QID) | INTRAVENOUS | Status: DC | PRN

## 2024-06-14 MED ORDER — PRAVASTATIN SODIUM 20 MG PO TABS
20.0000 mg | ORAL_TABLET | Freq: Every day | ORAL | Status: DC
  Administered 2024-06-14 – 2024-06-17 (×4): 20 mg via ORAL
  Filled 2024-06-14 (×5): qty 1

## 2024-06-14 MED ORDER — HYDRALAZINE HCL 20 MG/ML IJ SOLN: 10.0000 mg | INTRAMUSCULAR | Status: DC | PRN

## 2024-06-14 MED ORDER — SIMETHICONE 80 MG PO CHEW
40.0000 mg | CHEWABLE_TABLET | Freq: Four times a day (QID) | ORAL | Status: DC | PRN
Start: 2024-06-14 — End: 2024-06-18
  Filled 2024-06-14: qty 1

## 2024-06-14 MED ORDER — ENOXAPARIN SODIUM 40 MG/0.4ML IJ SOSY
40.0000 mg | PREFILLED_SYRINGE | INTRAMUSCULAR | Status: DC
  Administered 2024-06-15 – 2024-06-18 (×3): 40 mg via SUBCUTANEOUS
  Filled 2024-06-14 (×4): qty 0.4

## 2024-06-14 MED ORDER — ONDANSETRON HCL 4 MG/2ML IJ SOLN
4.0000 mg | Freq: Once | INTRAMUSCULAR | Status: DC
  Filled 2024-06-14: qty 2

## 2024-06-14 MED ORDER — MELATONIN 3 MG PO TABS: 3.0000 mg | ORAL_TABLET | Freq: Every evening | ORAL | Status: DC | PRN

## 2024-06-14 MED ORDER — LISINOPRIL 20 MG PO TABS
20.0000 mg | ORAL_TABLET | Freq: Every day | ORAL | Status: DC
  Filled 2024-06-14: qty 1

## 2024-06-14 NOTE — H&P (Signed)
 Julie Greene Mar 11, 1956  [id number].    Chief Complaint/Reason for Consult: perforated appendicitis with phlegmon  HPI:  This is a pleasant 68 yo black female with a history of HTN and HLD who began having RLQ abdominal pain last Tuesday.  She had some N/V.  She denies any fevers, diarrhea, CP, or SOB.  She saw her PCP on Thursday and her symptoms were a bit better.  She was diagnosed with enteritis.  Unfortunately, Friday he symptoms worsened again and she has been hurting all weekend.  She has had anorexia as well.  Due to persistent symptoms she presented to [facility] ED today for evaluation.  She has been found to have acute perforated appendicitis with a large phlegmon/abscess.  Her WBC is 10.6, K is 3.1.  We have been asked to see her for further evaluation and recommendations.  ROS: ROS: see HPI  Family History  Problem Relation Age of Onset   Diabetes Mother    Hyperlipidemia Mother    Hypertension Mother    Heart disease Mother    Diabetes Sister    Thyroid disease Sister    Cancer Paternal Grandmother    Hypertension Sister    Hyperlipidemia Sister    Hyperlipidemia Sister    Hypertension Sister    Diabetes Sister    Dementia Father     Past Medical History:  Diagnosis Date   Arthritis    Phreesia 08/19/2020   COLONIC POLYPS, HX OF 09/22/2009   GERD 09/22/2009   HEMATURIA, MICROSCOPIC, HX OF 09/22/2009   Hx of colonoscopy 08/29/2008   HYPERLIPIDEMIA 09/22/2009   Hyperlipidemia    Phreesia 08/19/2020   HYPERTENSION 01/22/2007   Hypertension    Phreesia 08/19/2020   Impaired glucose tolerance 01/06/2011   Vertigo     Past Surgical History:  Procedure Laterality Date   NO PAST SURGERIES      Social History:  reports that she has never smoked. She has never used smokeless tobacco. She reports current alcohol use of about 2.0 standard drinks of alcohol per week. She reports that she does not use drugs.  Allergies:  Allergies  Allergen Reactions   Sulfonamide  Derivatives     REACTION: hives    Medications Prior to Admission  Medication Sig Dispense Refill   amLODipine  (NORVASC ) 5 MG tablet Take 1 tablet (5 mg total) by mouth daily. 90 tablet 1   aspirin  81 MG tablet Take 81 mg by mouth daily.     Cholecalciferol (VITAMIN D ) 50 MCG (2000 UT) CAPS Take 1 capsule by mouth daily.     diclofenac  Sodium (VOLTAREN ) 1 % GEL Apply 2 g topically 4 (four) times daily.     Echinacea 125 MG CAPS Take 1 capsule by mouth daily.     lisinopril -hydrochlorothiazide  (ZESTORETIC ) 20-12.5 MG tablet Take 1 tablet by mouth daily. 90 tablet 1   meloxicam (MOBIC) 15 MG tablet Take 15 mg by mouth daily.     pravastatin  (PRAVACHOL ) 20 MG tablet Take 1 tablet (20 mg total) by mouth daily. 90 tablet 1   diclofenac  (VOLTAREN ) 75 MG EC tablet Take 1 tablet (75 mg total) by mouth 2 (two) times daily. (Patient not taking: Reported on 06/14/2024) 180 tablet 1   potassium chloride  SA (KLOR-CON  M20) 20 MEQ tablet Take 1 tablet (20 mEq total) by mouth daily. (Patient not taking: Reported on 06/14/2024) 90 tablet 1   valACYclovir  (VALTREX ) 500 MG tablet Take 1 tablet (500 mg total) by mouth daily. (Patient  not taking: Reported on 06/14/2024) 90 tablet 0   Vitamin D , Ergocalciferol , (DRISDOL ) 1.25 MG (50000 UNIT) CAPS capsule Take 1 capsule (50,000 Units total) by mouth every 7 (seven) days. (Patient not taking: Reported on 06/14/2024) 12 capsule 0     Physical Exam: Blood pressure 116/63, pulse 77, temperature 98.5 F (36.9 C), temperature source Oral, resp. rate 18, height 5' 4 (1.626 m), weight 76.2 kg, SpO2 96%. General: pleasant, WD, WN black female who is laying in bed in NAD HEENT: head is normocephalic, atraumatic.  Sclera are noninjected.  PERRL.  Ears and nose without any masses or lesions.  Mouth is pink and moist Heart: regular, rate, and rhythm.  Normal s1,s2. No obvious murmurs, gallops, or rubs noted.  Palpable radial and pedal pulses bilaterally Lungs: CTAB, no  wheezes, rhonchi, or rales noted.  Respiratory effort nonlabored Abd: soft, tender greatest in lower midline pelvic region and some in RLQ, ND, +BS, no masses, hernias, or organomegaly MS: all 4 extremities are symmetrical with no cyanosis, clubbing, or edema. Psych: A&Ox3 with an appropriate affect.   Results for orders placed or performed during the hospital encounter of 06/14/24 (from the past 48 hours)  Urinalysis, Routine w reflex microscopic -Urine, Clean Catch     Status: Abnormal   Collection Time: 06/14/24  9:05 AM  Result Value Ref Range   Color, Urine YELLOW YELLOW   APPearance CLEAR CLEAR   Specific Gravity, Urine 1.020 1.005 - 1.030   pH 7.5 5.0 - 8.0   Glucose, UA NEGATIVE NEGATIVE mg/dL   Hgb urine dipstick SMALL (A) NEGATIVE   Bilirubin Urine NEGATIVE NEGATIVE   Ketones, ur NEGATIVE NEGATIVE mg/dL   Protein, ur NEGATIVE NEGATIVE mg/dL   Nitrite NEGATIVE NEGATIVE   Leukocytes,Ua SMALL (A) NEGATIVE    Comment: Performed at [facility], [address] [place], [place] [address]  Urinalysis, Microscopic (reflex)     Status: Abnormal   Collection Time: 06/14/24  9:05 AM  Result Value Ref Range   RBC / HPF 6-10 0 - 5 RBC/hpf   WBC, UA 0-5 0 - 5 WBC/hpf   Bacteria, UA FEW (A) NONE SEEN   Squamous Epithelial / HPF 0-5 0 - 5 /HPF    Comment: Performed at [facility], [address] [place], [place] [address]  Comprehensive metabolic panel     Status: Abnormal   Collection Time: 06/14/24  9:21 AM  Result Value Ref Range   Sodium 139 135 - 145 mmol/L   Potassium 3.1 (L) 3.5 - 5.1 mmol/L   Chloride 99 98 - 111 mmol/L   CO2 29 22 - 32 mmol/L   Glucose, Bld 122 (H) 70 - 99 mg/dL    Comment: Glucose reference range applies only to samples taken after fasting for at least 8 hours.   BUN 13 8 - 23 mg/dL   Creatinine, Ser 9.19 0.44 - 1.00 mg/dL   Calcium 9.2 8.9 - 89.6 mg/dL   Total Protein 7.6 6.5 - 8.1 g/dL   Albumin 3.8 3.5 - 5.0 g/dL   AST 15 15  - 41 U/L   ALT 12 0 - 44 U/L   Alkaline Phosphatase 86 38 - 126 U/L   Total Bilirubin 0.6 0.0 - 1.2 mg/dL   GFR, Estimated >39 >39 mL/min    Comment: (NOTE) Calculated using the CKD-EPI Creatinine Equation (2021)    Anion gap 12 5 - 15    Comment: Performed at [facility], [address] [place], [place] [address]  Lipase, blood     Status: None   Collection Time: 06/14/24  9:21 AM  Result Value Ref Range   Lipase 25 11 - 51 U/L    Comment: Performed at [facility], [address] [place], [place] [address]  CBC with Diff     Status: Abnormal   Collection Time: 06/14/24  9:21 AM  Result Value Ref Range   WBC 10.6 (H) 4.0 - 10.5 K/uL   RBC 4.51 3.87 - 5.11 MIL/uL   Hemoglobin 12.7 12.0 - 15.0 g/dL   HCT 61.2 63.9 - 53.9 %   MCV 85.8 80.0 - 100.0 fL   MCH 28.2 26.0 - 34.0 pg   MCHC 32.8 30.0 - 36.0 g/dL   RDW 84.5 88.4 - 84.4 %   Platelets 371 150 - 400 K/uL   nRBC 0.0 0.0 - 0.2 %   Neutrophils Relative % 73 %   Neutro Abs 7.8 (H) 1.7 - 7.7 K/uL   Lymphocytes Relative 16 %   Lymphs Abs 1.7 0.7 - 4.0 K/uL   Monocytes Relative 9 %   Monocytes Absolute 0.9 0.1 - 1.0 K/uL   Eosinophils Relative 1 %   Eosinophils Absolute 0.1 0.0 - 0.5 K/uL   Basophils Relative 0 %   Basophils Absolute 0.0 0.0 - 0.1 K/uL   Immature Granulocytes 1 %   Abs Immature Granulocytes 0.06 0.00 - 0.07 K/uL    Comment: Performed at [facility], [address] [place], [place] [address]  Protime-INR     Status: None   Collection Time: 06/14/24  1:01 PM  Result Value Ref Range   Prothrombin Time 14.9 11.4 - 15.2 seconds   INR 1.1 0.8 - 1.2    Comment: (NOTE) INR goal varies based on device and disease states. Performed at [facility], [address] [place], [place] [address]    CT ABDOMEN PELVIS W CONTRAST Result Date: 06/14/2024 CLINICAL DATA:  Left lower quadrant and suprapubic pain. EXAM: CT ABDOMEN AND PELVIS WITH CONTRAST TECHNIQUE:  Multidetector CT imaging of the abdomen and pelvis was performed using the standard protocol following bolus administration of intravenous contrast. RADIATION DOSE REDUCTION: This exam was performed according to the departmental dose-optimization program which includes automated exposure control, adjustment of the mA and/or kV according to patient size and/or use of iterative reconstruction technique. CONTRAST:  OMNIPAQUE IOHEXOL 300 MG/ML  SOLN COMPARISON:  05/04/2009. FINDINGS: Lower chest: Bibasilar subsegmental volume loss. Heart is at the upper limits of normal in size to mildly enlarged. No pericardial or pleural effusion. Distal esophagus is grossly unremarkable. Hepatobiliary: Tiny left hepatic lobe cyst. No specific follow-up necessary. Liver and gallbladder are otherwise unremarkable. No biliary ductal dilatation. Pancreas: Negative. Spleen: Negative. Adrenals/Urinary Tract: Adrenal glands are unremarkable. Low-attenuation lesions in the kidneys, the majority of which are too small to characterize. No specific follow-up necessary. Kidneys are otherwise unremarkable. Ureters are decompressed. Bladder is grossly unremarkable. Stomach/Bowel: Tiny hiatal hernia. Stomach and small bowel are unremarkable. There is a complex thick-walled fluid collection in the right ileocolic mesentery, measuring 6.0 x 6.5 cm (2/62). The appendix is not seen separately. Surrounding inflammatory haziness and stranding. No extraluminal air. Small reactive ileocolic mesenteric lymph nodes. Mild secondary inflammatory hyperattenuation of the distal and terminal ileum. Colon is unremarkable. Vascular/Lymphatic: Atherosclerotic calcification of the aorta. No pathologically enlarged lymph nodes. Reproductive: Uterus is visualized.  No  adnexal mass. Other: Trace pelvic free fluid. Musculoskeletal: Degenerative changes in the spine. IMPRESSION: 1. Appendicitis with a large abscess. 2.  Aortic atherosclerosis (ICD10-I70.0).  Electronically Signed   By: Newell Eke M.D.   On: 06/14/2024 11:33      Assessment/Plan Acute perforated appendicitis with phlegmon/abscess The patient has been seen, examined, labs, vitals, chart, and imaging personally reviewed.  She has evidence of perforated appendicitis with a large phlegmon/abscess.  I have reviewed her imaging with IR who feels this is more phlegmonous right now and not amendable to drainage.  They recommend repeat CT in 3-5 days.  In the interim, she is hemodynamically stable and not ill-appearing.  We will give her CLD and IV abx therapy and treat her conservatively.  We discussed conservative management with IV abx, possible drain, and the possible need for surgery if she fails these other treatments.  She understands.     FEN - CLD/IVFs VTE - lovenox ID - Zosyn Admit - inpatient, med-surg  HTN - resume home meds Hypokalemia - resume home potassium  I reviewed ED provider notes, last 24 h vitals and pain scores, last 48 h intake and output, last 24 h labs and trends, and last 24 h imaging results.  Burnard FORBES Banter, [facility] 06/14/2024, 3:14 PM Please see Amion for pager number during day hours 7:00am-4:30pm or 7:00am -11:30am on weekends

## 2024-06-14 NOTE — ED Notes (Signed)
 ED Provider at bedside.

## 2024-06-14 NOTE — Plan of Care (Signed)

## 2024-06-14 NOTE — ED Triage Notes (Signed)
 Pt states severe lower abdominal pain that shoots up x 1 week  States was seen at pcp on Thursday and dx with gastritis  No prescriptions given  Denies fever, reports nausea and vomiting but denies diarrhea

## 2024-06-14 NOTE — ED Provider Notes (Signed)
 [place] [facility] AT [facility] Provider Note   CSN: [id number] Arrival date & time: 06/14/24  9151     Patient presents with: Abdominal Pain   Julie Greene is a 68 y.o. female.  Patient is a 68 year old female with a history of hyperlipidemia, hypertension, and GERD who presents to the ED for increasing abdominal pain for the past 6 days.  Patient notes pain has been lower in the abdomen and radiates up.  She did see her PCP 4 days ago and was told she had gastritis but pain has continued to get worse.  She has tried over-the-counter remedies including ginger ale and antacids with minimal relief.  Notes associated nausea/vomiting as well.  States vomiting began 2 days ago and she has been able to eat very little.  Denies previous abdominal surgeries.  Denies fevers, chills, headache, chest pain, shortness of breath, diarrhea/constipation, urinary symptoms.  No further complaints.  Abdominal Pain Associated symptoms: nausea and vomiting   Associated symptoms: no chest pain, no constipation, no diarrhea, no dysuria, no fever and no shortness of breath        Prior to Admission medications   Medication Sig Start Date End Date Taking? Authorizing Provider  amLODipine  (NORVASC ) 5 MG tablet Take 1 tablet (5 mg total) by mouth daily. 08/22/20   Bari Theodoro FALCON, MD  aspirin  81 MG tablet Take 81 mg by mouth daily.    [provider]  Cholecalciferol (VITAMIN D ) 50 MCG (2000 UT) CAPS Take by mouth.    [provider]  diclofenac  (VOLTAREN ) 75 MG EC tablet Take 1 tablet (75 mg total) by mouth 2 (two) times daily. 06/06/20   Bari Theodoro FALCON, MD  Echinacea 125 MG CAPS Take 1 capsule by mouth daily.    [provider]  lisinopril -hydrochlorothiazide  (ZESTORETIC ) 20-12.5 MG tablet Take 1 tablet by mouth daily. 08/22/20   Bari Theodoro FALCON, MD  potassium chloride  SA (KLOR-CON  M20) 20 MEQ tablet Take 1 tablet (20 mEq total) by mouth daily.  08/22/20   Bari Theodoro FALCON, MD  pravastatin  (PRAVACHOL ) 20 MG tablet Take 1 tablet (20 mg total) by mouth daily. 08/22/20   Bari Theodoro FALCON, MD  traMADol (ULTRAM) 50 MG tablet Take 50 mg by mouth every 6 (six) hours as needed.  08/17/18   [provider]  TURMERIC PO Take 1 tablet by mouth daily.     [provider]  valACYclovir  (VALTREX ) 500 MG tablet Take 1 tablet (500 mg total) by mouth daily. 08/22/20   Bari Theodoro FALCON, MD  Vitamin D , Ergocalciferol , (DRISDOL ) 1.25 MG (50000 UNIT) CAPS capsule Take 1 capsule (50,000 Units total) by mouth every 7 (seven) days. 08/22/20   Bari Theodoro FALCON, MD    Allergies: Sulfonamide derivatives    Review of Systems  Constitutional:  Negative for fever.  Respiratory:  Negative for shortness of breath.   Cardiovascular:  Negative for chest pain.  Gastrointestinal:  Positive for abdominal pain, nausea and vomiting. Negative for constipation and diarrhea.  Genitourinary:  Negative for dysuria.  All other systems reviewed and are negative.   Updated Vital Signs BP (!) 136/120   Pulse 89   Temp 97.9 F (36.6 C) (Oral)   Resp 18   Ht 5' 4 (1.626 m)   Wt 76.2 kg   SpO2 96%   BMI 28.84 kg/m   Physical Exam Constitutional:      Appearance: She is well-developed.  HENT:     Head: Normocephalic  and atraumatic.  Cardiovascular:     Rate and Rhythm: Normal rate.  Pulmonary:     Effort: Pulmonary effort is normal.  Abdominal:     Palpations: Abdomen is soft.     Tenderness: There is abdominal tenderness in the periumbilical area, suprapubic area and left lower quadrant.  Neurological:     Mental Status: She is alert and oriented to person, place, and time.  Psychiatric:        Mood and Affect: Mood normal.        Behavior: Behavior normal.     (all labs ordered are listed, but only abnormal results are displayed) Labs Reviewed  COMPREHENSIVE METABOLIC PANEL WITH GFR - Abnormal; Notable for the following components:       Result Value   Potassium 3.1 (*)    Glucose, Bld 122 (*)    All other components within normal limits  CBC WITH DIFFERENTIAL/PLATELET - Abnormal; Notable for the following components:   WBC 10.6 (*)    Neutro Abs 7.8 (*)    All other components within normal limits  URINALYSIS, ROUTINE W REFLEX MICROSCOPIC - Abnormal; Notable for the following components:   Hgb urine dipstick SMALL (*)    Leukocytes,Ua SMALL (*)    All other components within normal limits  URINALYSIS, MICROSCOPIC (REFLEX) - Abnormal; Notable for the following components:   Bacteria, UA FEW (*)    All other components within normal limits  LIPASE, BLOOD    EKG: None  Radiology: CT ABDOMEN PELVIS W CONTRAST Result Date: 06/14/2024 CLINICAL DATA:  Left lower quadrant and suprapubic pain. EXAM: CT ABDOMEN AND PELVIS WITH CONTRAST TECHNIQUE: Multidetector CT imaging of the abdomen and pelvis was performed using the standard protocol following bolus administration of intravenous contrast. RADIATION DOSE REDUCTION: This exam was performed according to the departmental dose-optimization program which includes automated exposure control, adjustment of the mA and/or kV according to patient size and/or use of iterative reconstruction technique. CONTRAST:  OMNIPAQUE IOHEXOL 300 MG/ML  SOLN COMPARISON:  05/04/2009. FINDINGS: Lower chest: Bibasilar subsegmental volume loss. Heart is at the upper limits of normal in size to mildly enlarged. No pericardial or pleural effusion. Distal esophagus is grossly unremarkable. Hepatobiliary: Tiny left hepatic lobe cyst. No specific follow-up necessary. Liver and gallbladder are otherwise unremarkable. No biliary ductal dilatation. Pancreas: Negative. Spleen: Negative. Adrenals/Urinary Tract: Adrenal glands are unremarkable. Low-attenuation lesions in the kidneys, the majority of which are too small to characterize. No specific follow-up necessary. Kidneys are otherwise unremarkable. Ureters  are decompressed. Bladder is grossly unremarkable. Stomach/Bowel: Tiny hiatal hernia. Stomach and small bowel are unremarkable. There is a complex thick-walled fluid collection in the right ileocolic mesentery, measuring 6.0 x 6.5 cm (2/62). The appendix is not seen separately. Surrounding inflammatory haziness and stranding. No extraluminal air. Small reactive ileocolic mesenteric lymph nodes. Mild secondary inflammatory hyperattenuation of the distal and terminal ileum. Colon is unremarkable. Vascular/Lymphatic: Atherosclerotic calcification of the aorta. No pathologically enlarged lymph nodes. Reproductive: Uterus is visualized.  No adnexal mass. Other: Trace pelvic free fluid. Musculoskeletal: Degenerative changes in the spine. IMPRESSION: 1. Appendicitis with a large abscess. 2.  Aortic atherosclerosis (ICD10-I70.0). Electronically Signed   By: Newell Eke M.D.   On: 06/14/2024 11:33      Medications Ordered in the ED  morphine (PF) 4 MG/ML injection 4 mg (4 mg Intravenous Patient Refused/Not Given 06/14/24 0937)  ondansetron (ZOFRAN) injection 4 mg (4 mg Intravenous Patient Refused/Not Given 06/14/24 0937)  piperacillin-tazobactam (ZOSYN) IVPB 3.375  g (3.375 g Intravenous New Bag/Given 06/14/24 1150)  lactated ringers bolus 1,000 mL (0 mLs Intravenous Stopped 06/14/24 1106)  iohexol (OMNIPAQUE) 300 MG/ML solution 100 mL (100 mLs Intravenous Contrast Given 06/14/24 1040)                                   Medical Decision Making Amount and/or Complexity of Data Reviewed Labs: ordered. Radiology: ordered.  Risk Prescription drug management.   Patient is a 68 year old female with a history of hyperlipidemia, hypertension, and GERD who presents to the ED for increasing lower abdominal pain for the past week.  Please see detailed HPI above.  On exam patient is alert and uncomfortable lying on the bed.  Physical exam as noted above.  Labs show mild leukocytosis of 10.6 and mild  hypokalemia with a potassium of 3.1.  UA does not show acute signs of infection.  CT abdomen pelvis reviewed that did show acute appendicitis with a large abscess that is around 6 x 6 cm.  Patient has been given a liter of fluids and started on Zosyn.  General surgery was consulted who advised that patient would need transfer to [facility] for further evaluation and management.  They are agreeable to accept patient for transfer today.  Dr. Tanda will be the accepting attending physician.  Patient currently stable while in ED and awaiting transport.  I discussed this case with my attending physician who cosigned this note including patient's presenting symptoms, physical exam, and planned diagnostics and interventions. Attending physician stated agreement with plan or made changes to plan which were implemented.   Attending physician assessed patient at bedside.     Final diagnoses:  Acute appendicitis with localized peritonitis and abscess, without gangrene or perforation    ED Discharge Orders     None          Julie Greene 06/14/24 1239    Yolande Lamar BROCKS, MD 06/15/24 1259

## 2024-06-15 LAB — BASIC METABOLIC PANEL WITH GFR
Anion gap: 11 (ref 5–15)
BUN: 10 mg/dL (ref 8–23)
CO2: 28 mmol/L (ref 22–32)
Calcium: 8.6 mg/dL — ABNORMAL LOW (ref 8.9–10.3)
Chloride: 101 mmol/L (ref 98–111)
Creatinine, Ser: 0.88 mg/dL (ref 0.44–1.00)
GFR, Estimated: >60 mL/min (ref >60)
Glucose, Bld: 112 mg/dL — ABNORMAL HIGH (ref 70–99)
Potassium: 3.5 mmol/L (ref 3.5–5.1)
Sodium: 140 mmol/L (ref 135–145)

## 2024-06-15 LAB — MISC LABCORP TEST (SEND OUT): Labcorp test code: 83935

## 2024-06-15 LAB — CBC
HCT: 36.3 % (ref 36.0–46.0)
Hemoglobin: 11.8 g/dL — ABNORMAL LOW (ref 12.0–15.0)
MCH: 28.4 pg (ref 26.0–34.0)
MCHC: 32.5 g/dL (ref 30.0–36.0)
MCV: 87.5 fL (ref 80.0–100.0)
Platelets: 358 K/uL (ref 150–400)
RBC: 4.15 MIL/uL (ref 3.87–5.11)
RDW: 15.5 % (ref 11.5–15.5)
WBC: 9.5 K/uL (ref 4.0–10.5)
nRBC: 0 % (ref 0.0–0.2)

## 2024-06-15 MED ORDER — TRAMADOL HCL 50 MG PO TABS
50.0000 mg | ORAL_TABLET | Freq: Four times a day (QID) | ORAL | Status: DC | PRN
  Administered 2024-06-15: 50 mg via ORAL
  Filled 2024-06-15: qty 1

## 2024-06-15 MED ORDER — KCL IN DEXTROSE-NACL 20-5-0.45 MEQ/L-%-% IV SOLN
INTRAVENOUS | Status: AC
  Filled 2024-06-15: qty 1000

## 2024-06-15 NOTE — TOC Initial Note (Signed)
 Transition of Care [facility]) - Initial/Assessment Note    Patient Details  Name: Julie Greene MRN: [id number] Date of Birth: 10-30-1955  [facility]) CM/SW Contact:    Alfonse JONELLE Rex, RN Phone Number: 06/15/2024, 11:21 AM  Clinical Narrative:  patient admitted with c/o abdominal pain, dx : Acute perforated appendicitis with phlegmon/abscess, undergoing conservative treatment-iv abx, consult for possible drainage. Patient resides in a private residence, PCP and insurance on file. INPT CM will continue to follow for dc needs.                     Expected Discharge Plan: Home/Self Care Barriers to Discharge: Continued Medical Work up   Patient Goals and CMS Choice Patient states their goals for this hospitalization and ongoing recovery are:: return home          Expected Discharge Plan and Services       Living arrangements for the past 2 months: Single Family Home                                      Prior Living Arrangements/Services Living arrangements for the past 2 months: Single Family Home   Patient language and need for interpreter reviewed:: Yes        Need for Family Participation in Patient Care: Yes (Comment) Care giver support system in place?: Yes (comment)   Criminal Activity/Legal Involvement Pertinent to Current Situation/Hospitalization: No - Comment as needed  Activities of Daily Living   ADL Screening (condition at time of admission) Independently performs ADLs?: Yes (appropriate for developmental age) Is the patient deaf or have difficulty hearing?: No Does the patient have difficulty seeing, even when wearing glasses/contacts?: Yes Does the patient have difficulty concentrating, remembering, or making decisions?: No  Permission Sought/Granted                  Emotional Assessment       Orientation: : Oriented to Self, Oriented to Place, Oriented to  Time, Oriented to Situation Alcohol / Substance Use: Not  Applicable Psych Involvement: No (comment)  Admission diagnosis:  Acute perforated appendicitis [K35.32] Acute appendicitis with localized peritonitis and abscess, without gangrene or perforation [K35.30] Patient Active Problem List   Diagnosis Date Noted   Acute perforated appendicitis 06/14/2024   Confusion 05/12/2020   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   Osteopenia 03/23/2019   OA (osteoarthritis) of knee 01/06/2019   Hypokalemia 01/06/2019   Ganglion cyst of wrist, left 04/23/2017   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   Vitamin D  deficiency 10/21/2016   Abnormal TSH 10/21/2016   Obese 08/25/2015   Acne 05/27/2014   Edema 04/13/2014   Anxiety and depression 01/08/2011   Borderline diabetes 01/06/2011   Hyperlipidemia 09/22/2009   GERD 09/22/2009   History of colonic polyps 09/22/2009   Essential hypertension 01/22/2007   PCP:  Rik Glinda DASEN, FNP Pharmacy:   [facility] [phone], [place] - [address] AT [address] [place] [address] Phone: [phone] Fax: [phone]     Social Drivers of Health (SDOH) Social History: SDOH Screenings   Food Insecurity: No Food Insecurity (06/14/2024)  Housing: Low Risk  (06/14/2024)  Transportation Needs: No Transportation Needs (06/14/2024)  Utilities: Not At Risk (06/14/2024)  Alcohol Screen: Low Risk  (08/22/2020)  Depression (PHQ2-9): Low Risk  (08/22/2020)  Social Connections: Moderately  Isolated (06/14/2024)  Tobacco Use: Low Risk  (06/14/2024)   SDOH Interventions:     Readmission Risk Interventions    06/15/2024   11:20 AM  Readmission Risk Prevention Plan  Post Dischage Appt Complete  Medication Screening Complete  Transportation Screening Complete

## 2024-06-15 NOTE — Progress Notes (Signed)
 Subjective: Pain is improving today.  Up in the bathroom getting cleaned up.  Hungry.  ROS: See above, otherwise other systems negative  Objective: Vital signs in last 24 hours: Temp:  [98 F (36.7 C)-98.8 F (37.1 C)] 98 F (36.7 C) (11/18 0944) Pulse Rate:  [41-91] 79 (11/18 0944) Resp:  [16-18] 16 (11/18 0944) BP: (86-140)/(46-120) 120/69 (11/18 0944) SpO2:  [80 %-98 %] 96 % (11/18 0944) Last BM Date : 06/13/24  Intake/Output from previous day: 11/17 0701 - 11/18 0700 In: 2076.7 [P.O.:550; I.V.:403; IV Piggyback:1123.7] Out: -  Intake/Output this shift: No intake/output data recorded.  PE: Gen: NAD, up in the bathroom Unable to exam further as she is without clothes and has the door cracked only  Lab Results:  Recent Labs    06/14/24 0921 06/15/24 0526  WBC 10.6* 9.5  HGB 12.7 11.8*  HCT 38.7 36.3  PLT 371 358   BMET Recent Labs    06/14/24 0921 06/15/24 0526  NA 139 140  K 3.1* 3.5  CL 99 101  CO2 29 28  GLUCOSE 122* 112*  BUN 13 10  CREATININE 0.80 0.88  CALCIUM 9.2 8.6*   PT/INR Recent Labs    06/14/24 1301  LABPROT 14.9  INR 1.1   CMP     Component Value Date/Time   NA 140 06/15/2024 0526   K 3.5 06/15/2024 0526   CL 101 06/15/2024 0526   CO2 28 06/15/2024 0526   GLUCOSE 112 (H) 06/15/2024 0526   BUN 10 06/15/2024 0526   CREATININE 0.88 06/15/2024 0526   CREATININE 0.87 08/17/2020 1046   CALCIUM 8.6 (L) 06/15/2024 0526   PROT 7.6 06/14/2024 0921   ALBUMIN 3.8 06/14/2024 0921   AST 15 06/14/2024 0921   ALT 12 06/14/2024 0921   ALKPHOS 86 06/14/2024 0921   BILITOT 0.6 06/14/2024 0921   GFRNONAA >60 06/15/2024 0526   GFRNONAA 70 08/17/2020 1046   GFRAA 81 08/17/2020 1046   Lipase     Component Value Date/Time   LIPASE 25 06/14/2024 0921       Studies/Results: CT ABDOMEN PELVIS W CONTRAST Result Date: 06/14/2024 CLINICAL DATA:  Left lower quadrant and suprapubic pain. EXAM: CT ABDOMEN AND PELVIS WITH CONTRAST  TECHNIQUE: Multidetector CT imaging of the abdomen and pelvis was performed using the standard protocol following bolus administration of intravenous contrast. RADIATION DOSE REDUCTION: This exam was performed according to the departmental dose-optimization program which includes automated exposure control, adjustment of the mA and/or kV according to patient size and/or use of iterative reconstruction technique. CONTRAST:  OMNIPAQUE IOHEXOL 300 MG/ML  SOLN COMPARISON:  05/04/2009. FINDINGS: Lower chest: Bibasilar subsegmental volume loss. Heart is at the upper limits of normal in size to mildly enlarged. No pericardial or pleural effusion. Distal esophagus is grossly unremarkable. Hepatobiliary: Tiny left hepatic lobe cyst. No specific follow-up necessary. Liver and gallbladder are otherwise unremarkable. No biliary ductal dilatation. Pancreas: Negative. Spleen: Negative. Adrenals/Urinary Tract: Adrenal glands are unremarkable. Low-attenuation lesions in the kidneys, the majority of which are too small to characterize. No specific follow-up necessary. Kidneys are otherwise unremarkable. Ureters are decompressed. Bladder is grossly unremarkable. Stomach/Bowel: Tiny hiatal hernia. Stomach and small bowel are unremarkable. There is a complex thick-walled fluid collection in the right ileocolic mesentery, measuring 6.0 x 6.5 cm (2/62). The appendix is not seen separately. Surrounding inflammatory haziness and stranding. No extraluminal air. Small reactive ileocolic mesenteric lymph nodes. Mild secondary inflammatory hyperattenuation of the distal  and terminal ileum. Colon is unremarkable. Vascular/Lymphatic: Atherosclerotic calcification of the aorta. No pathologically enlarged lymph nodes. Reproductive: Uterus is visualized.  No adnexal mass. Other: Trace pelvic free fluid. Musculoskeletal: Degenerative changes in the spine. IMPRESSION: 1. Appendicitis with a large abscess. 2.  Aortic atherosclerosis  (ICD10-I70.0). Electronically Signed   By: Newell Eke M.D.   On: 06/14/2024 11:33    Anti-infectives: Anti-infectives (From admission, onward)    Start     Dose/Rate Route Frequency Ordered Stop   06/14/24 1800  piperacillin-tazobactam (ZOSYN) IVPB 3.375 g        3.375 g 12.5 mL/hr over 240 Minutes Intravenous Every 8 hours 06/14/24 1301 06/21/24 1759   06/14/24 1145  piperacillin-tazobactam (ZOSYN) IVPB 3.375 g        3.375 g 12.5 mL/hr over 240 Minutes Intravenous  Once 06/14/24 1138 06/14/24 1349        Assessment/Plan Acute perforated appendicitis with phlegmon/abscess -may have CLD today -cont IV abx -likely repeat CT in several days to eval for ability to drain this collection -will likely need interval lap appy in 6-8 weeks -will need to ask when last cscope was as if it has been a while she will need this as well   FEN - CLD/IVFs VTE - lovenox ID - zosyn  HTN  I reviewed nursing notes, last 24 h vitals and pain scores, last 48 h intake and output, last 24 h labs and trends, and last 24 h imaging results.   LOS: 1 day    Burnard FORBES Banter , [facility] 06/15/2024, 10:39 AM Please see Amion for pager number during day hours 7:00am-4:30pm or 7:00am -11:30am on weekends

## 2024-06-16 LAB — BASIC METABOLIC PANEL WITH GFR
Anion gap: 9 (ref 5–15)
BUN: 6 mg/dL — ABNORMAL LOW (ref 8–23)
CO2: 28 mmol/L (ref 22–32)
Calcium: 8.9 mg/dL (ref 8.9–10.3)
Chloride: 104 mmol/L (ref 98–111)
Creatinine, Ser: 0.92 mg/dL (ref 0.44–1.00)
GFR, Estimated: >60 mL/min (ref >60)
Glucose, Bld: 100 mg/dL — ABNORMAL HIGH (ref 70–99)
Potassium: 3.8 mmol/L (ref 3.5–5.1)
Sodium: 140 mmol/L (ref 135–145)

## 2024-06-16 LAB — CBC
HCT: 35.4 % — ABNORMAL LOW (ref 36.0–46.0)
Hemoglobin: 11.2 g/dL — ABNORMAL LOW (ref 12.0–15.0)
MCH: 28.2 pg (ref 26.0–34.0)
MCHC: 31.6 g/dL (ref 30.0–36.0)
MCV: 89.2 fL (ref 80.0–100.0)
Platelets: 357 K/uL (ref 150–400)
RBC: 3.97 MIL/uL (ref 3.87–5.11)
RDW: 15.2 % (ref 11.5–15.5)
WBC: 11.1 K/uL — ABNORMAL HIGH (ref 4.0–10.5)
nRBC: 0 % (ref 0.0–0.2)

## 2024-06-16 NOTE — Plan of Care (Signed)

## 2024-06-16 NOTE — Progress Notes (Signed)
 Subjective: Pain is improved and almost has no pain.  Starting having BRBPR this morning.  One episode with 200-300cc per RN.  Patient just had another mixed BM with blood and brown stool.  Denies history of hemorrhoids.  Otherwise feels fine  ROS: See above, otherwise other systems negative  Objective: Vital signs in last 24 hours: Temp:  [97.4 F (36.3 C)] 97.4 F (36.3 C) (11/18 2013) Pulse Rate:  [74-87] 74 (11/18 2013) Resp:  [16-18] 18 (11/18 2013) BP: (105-113)/(61-85) 105/85 (11/18 2013) SpO2:  [96 %-97 %] 97 % (11/18 2013) Last BM Date : 06/15/24  Intake/Output from previous day: 11/18 0701 - 11/19 0700 In: 2351 [P.O.:1600; I.V.:597; IV Piggyback:154.1] Out: -  Intake/Output this shift: Total I/O In: 240 [P.O.:240] Out: -   PE: Gen: NAD, sitting up in her chair Abd: soft, NT, ND, +BS Rectal: no external hemorrhoids noted, no pain externally  Lab Results:  Recent Labs    06/14/24 0921 06/15/24 0526  WBC 10.6* 9.5  HGB 12.7 11.8*  HCT 38.7 36.3  PLT 371 358   BMET Recent Labs    06/15/24 0526 06/16/24 0507  NA 140 140  K 3.5 3.8  CL 101 104  CO2 28 28  GLUCOSE 112* 100*  BUN 10 6*  CREATININE 0.88 0.92  CALCIUM 8.6* 8.9   PT/INR Recent Labs    06/14/24 1301  LABPROT 14.9  INR 1.1   CMP     Component Value Date/Time   NA 140 06/16/2024 0507   K 3.8 06/16/2024 0507   CL 104 06/16/2024 0507   CO2 28 06/16/2024 0507   GLUCOSE 100 (H) 06/16/2024 0507   BUN 6 (L) 06/16/2024 0507   CREATININE 0.92 06/16/2024 0507   CREATININE 0.87 08/17/2020 1046   CALCIUM 8.9 06/16/2024 0507   PROT 7.6 06/14/2024 0921   ALBUMIN 3.8 06/14/2024 0921   AST 15 06/14/2024 0921   ALT 12 06/14/2024 0921   ALKPHOS 86 06/14/2024 0921   BILITOT 0.6 06/14/2024 0921   GFRNONAA >60 06/16/2024 0507   GFRNONAA 70 08/17/2020 1046   GFRAA 81 08/17/2020 1046   Lipase     Component Value Date/Time   LIPASE 25 06/14/2024 0921        Studies/Results: No results found.   Anti-infectives: Anti-infectives (From admission, onward)    Start     Dose/Rate Route Frequency Ordered Stop   06/14/24 1800  piperacillin-tazobactam (ZOSYN) IVPB 3.375 g        3.375 g 12.5 mL/hr over 240 Minutes Intravenous Every 8 hours 06/14/24 1301 06/21/24 1759   06/14/24 1145  piperacillin-tazobactam (ZOSYN) IVPB 3.375 g        3.375 g 12.5 mL/hr over 240 Minutes Intravenous  Once 06/14/24 1138 06/14/24 1349        Assessment/Plan Acute perforated appendicitis with phlegmon/abscess -FLD, hungry -cont IV abx -likely repeat CT in several days to eval for ability to drain this collection -will likely need interval lap appy in 6-8 weeks -last cscope was 2 years ago by [facility].  Per patient unremarkable  GI bleed -starting have BRBPR this am.  Unclear etiology.   -vitals are stable currently -stat CBC, CBC in am -continue to closely monitor for need for further evaluation by GI vs CTA, etc.  Would be abnormal to have a GI bleed secondary to perforated appendicitis -closely monitor -hold lovenox today  FEN - FLD/IVFs VTE - lovenox, on hold today ID - zosyn  HTN  I reviewed nursing notes, last 24 h vitals and pain scores, last 48 h intake and output, last 24 h labs and trends, and last 24 h imaging results.   LOS: 2 days    Burnard FORBES Banter , [facility] 06/16/2024, 11:45 AM Please see Amion for pager number during day hours 7:00am-4:30pm or 7:00am -11:30am on weekends

## 2024-06-17 LAB — CBC
HCT: 34.1 % — ABNORMAL LOW (ref 36.0–46.0)
Hemoglobin: 10.9 g/dL — ABNORMAL LOW (ref 12.0–15.0)
MCH: 28.2 pg (ref 26.0–34.0)
MCHC: 32 g/dL (ref 30.0–36.0)
MCV: 88.1 fL (ref 80.0–100.0)
Platelets: 345 K/uL (ref 150–400)
RBC: 3.87 MIL/uL (ref 3.87–5.11)
RDW: 15.3 % (ref 11.5–15.5)
WBC: 7.3 K/uL (ref 4.0–10.5)
nRBC: 0 % (ref 0.0–0.2)

## 2024-06-17 NOTE — Progress Notes (Signed)
 Patient ID: Julie Greene, female   DOB: 07/29/1956, 68 y.o.   MRN: [id number]   Acute Care Surgery Service Progress Note:    Chief Complaint/Subjective: Tolerating liquids.  No nausea or vomiting.  The blood per rectum has stopped. Minimal pain  Objective: Vital signs in last 24 hours: Temp:  [97.6 F (36.4 C)-98.8 F (37.1 C)] 98.8 F (37.1 C) (11/20 0526) Pulse Rate:  [75-91] 75 (11/20 0526) Resp:  [16-18] 18 (11/20 0526) BP: (121-135)/(63-69) 135/63 (11/20 0526) SpO2:  [97 %-100 %] 97 % (11/20 0526) Last BM Date : 06/16/24  Intake/Output from previous day: 11/19 0701 - 11/20 0700 In: 1920 [P.O.:1920] Out: -  Intake/Output this shift: Total I/O In: 240 [P.O.:240] Out: -   Lungs: , nonlabored  Cardiovascular: reg  Abd: Soft, nondistended, essentially nontender; no rebound or guarding  Extremities: no edema, +SCDs  Neuro: alert, nonfocal  Lab Results: CBC  Recent Labs    06/16/24 1158 06/17/24 0518  WBC 11.1* 7.3  HGB 11.2* 10.9*  HCT 35.4* 34.1*  PLT 357 345   BMET Recent Labs    06/15/24 0526 06/16/24 0507  NA 140 140  K 3.5 3.8  CL 101 104  CO2 28 28  GLUCOSE 112* 100*  BUN 10 6*  CREATININE 0.88 0.92  CALCIUM 8.6* 8.9   LFT    Latest Ref Rng & Units 06/14/2024    9:21 AM 08/17/2020   10:46 AM 11/12/2019    8:03 AM  Hepatic Function  Total Protein 6.5 - 8.1 g/dL 7.6  7.4  7.5   Albumin 3.5 - 5.0 g/dL 3.8     AST 15 - 41 U/L 15  20  21    ALT 0 - 44 U/L 12  18  22    Alk Phosphatase 38 - 126 U/L 86     Total Bilirubin 0.0 - 1.2 mg/dL 0.6  0.5  0.5    PT/INR Recent Labs    06/14/24 1301  LABPROT 14.9  INR 1.1   ABG No results for input(s): PHART, HCO3 in the last 72 hours.  Invalid input(s): PCO2, PO2  Studies/Results:  Anti-infectives: Anti-infectives (From admission, onward)    Start     Dose/Rate Route Frequency Ordered Stop   06/14/24 1800  piperacillin-tazobactam (ZOSYN) IVPB 3.375 g        3.375 g 12.5  mL/hr over 240 Minutes Intravenous Every 8 hours 06/14/24 1301 06/21/24 1759   06/14/24 1145  piperacillin-tazobactam (ZOSYN) IVPB 3.375 g        3.375 g 12.5 mL/hr over 240 Minutes Intravenous  Once 06/14/24 1138 06/14/24 1349       Medications: Scheduled Meds:  acetaminophen   1,000 mg Oral Q6H   amLODipine   5 mg Oral Daily   enoxaparin (LOVENOX) injection  40 mg Subcutaneous Q24H   ondansetron (ZOFRAN) IV  4 mg Intravenous Once   potassium chloride  SA  20 mEq Oral Daily   pravastatin   20 mg Oral QHS   Continuous Infusions:  piperacillin-tazobactam (ZOSYN)  IV 3.375 g (06/17/24 0938)   PRN Meds:.diphenhydrAMINE **OR** diphenhydrAMINE, hydrALAZINE, melatonin, metoprolol tartrate, morphine injection, ondansetron **OR** ondansetron (ZOFRAN) IV, simethicone, traMADol  Assessment/Plan: Patient Active Problem List   Diagnosis Date Noted   Acute perforated appendicitis 06/14/2024   Confusion 05/12/2020   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   Osteopenia 03/23/2019   OA (osteoarthritis) of knee 01/06/2019   Hypokalemia 01/06/2019   Ganglion cyst of wrist, left 04/23/2017  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 -Discussed the typical treatment plan such as potential risk of recurrent appendicitis with nonoperative management and long-term, if she chooses nonoperative management for the long-term then she will likely  need a colonoscopy to just to make sure nothing going on in the cecum and appendix area; versus interval appendectomy.  Discussed the pros and cons of each. -last cscope was 2 years ago by [facility].  Per patient unremarkable   GI bleed -resolved -starting have BRBPR this am.  Unclear etiology.   -vitals are stable currently -CBC has been stable -closely monitor -hold lovenox today   FEN -soft/IVFs VTE - lovenox, on hold today ID - [email or web address]   HTN   I reviewed nursing notes, last 24 h vitals and pain scores, last 48 h intake and output, last 24 h labs and trends, and last 24 h imaging results. Disposition: Plan discharge tomorrow if continues to do well today  LOS: 3 days    Julie HERO. Tanda, MD, FACS General, Bariatric, & Minimally Invasive Surgery [phone] [facility], [facility]

## 2024-06-18 LAB — CBC
HCT: 35 % — ABNORMAL LOW (ref 36.0–46.0)
Hemoglobin: 11.2 g/dL — ABNORMAL LOW (ref 12.0–15.0)
MCH: 28.4 pg (ref 26.0–34.0)
MCHC: 32 g/dL (ref 30.0–36.0)
MCV: 88.8 fL (ref 80.0–100.0)
Platelets: 354 K/uL (ref 150–400)
RBC: 3.94 MIL/uL (ref 3.87–5.11)
RDW: 15.2 % (ref 11.5–15.5)
WBC: 7.8 K/uL (ref 4.0–10.5)
nRBC: 0 % (ref 0.0–0.2)

## 2024-06-18 MED ORDER — ACETAMINOPHEN 500 MG PO TABS: 1000.0000 mg | ORAL_TABLET | Freq: Four times a day (QID) | ORAL | Status: DC | PRN

## 2024-06-18 MED ORDER — AMOXICILLIN-POT CLAVULANATE 875-125 MG PO TABS: 1.0000 | ORAL_TABLET | Freq: Two times a day (BID) | ORAL | 0 refills | Status: AC

## 2024-06-18 NOTE — Plan of Care (Signed)

## 2024-06-18 NOTE — Progress Notes (Signed)
 Mobility Specialist Progress Note:   06/18/24 1030  Mobility  Activity Ambulated with assistance  Level of Assistance Modified independent, requires aide device or extra time  Assistive Device  (IV pole)  Distance Ambulated (ft) 300 ft  Activity Response Tolerated well  Mobility Referral Yes  Mobility visit 1 Mobility  Mobility Specialist Start Time (ACUTE ONLY) 1017  Mobility Specialist Stop Time (ACUTE ONLY) 1029  Mobility Specialist Time Calculation (min) (ACUTE ONLY) 12 min   Pt was received in bed and agreed to mobility. No complaints during ambulation. Returned to use restroom. All needs were met at the end of session. RN notified.  [employer] - Mobility Specialist

## 2024-06-18 NOTE — Discharge Summary (Signed)
 Patient ID: Julie Greene [id number] [birth year] 68 y.o.  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 She had some N/V. She denies any fevers, diarrhea, CP, or SOB. She saw her PCP on Thursday and her symptoms were a bit better. She was diagnosed with enteritis. Unfortunately, Friday he symptoms worsened again and she has been hurting all weekend. She has had anorexia as well. Due to persistent symptoms she presented to [facility] ED today for evaluation. She has been found to have acute perforated appendicitis with a large phlegmon/abscess. Her WBC is 10.6, K is 3.1. We have been asked to see her for further evaluation and recommendations.   Procedures none  Hospital Course:  The patient was admitted and started on IV abx therapy.  Her pain rapidly improved as did she.  Her diet was advanced as tolerated.  She did have 2 episodes of BRBPR on HD 2, but this was self-limiting and hgb remained stable.  Her WBC was normal and she was tolerating a solid diet upon discharge.  She  was discharged home with 10 additional days of Augmentin .  She will follow up in our office to discuss operative vs non-operative management pending how the patient is doing.    Physical Exam: Gen: NAD Abd: soft, NT, ND  Allergies as of 06/18/2024       Reactions   Sulfonamide Derivatives    REACTION: hives        Medication List     STOP taking these medications    diclofenac  75 MG EC tablet Commonly known as: VOLTAREN    potassium chloride  SA 20 MEQ tablet Commonly known as: Klor-Con  M20   valACYclovir  500 MG tablet Commonly known as: VALTREX    Vitamin D  (Ergocalciferol ) 1.25 MG (50000 UNIT) Caps capsule Commonly known as: DRISDOL        TAKE these medications    acetaminophen  500 MG tablet Commonly known as: TYLENOL  Take 2 tablets (1,000 mg total) by mouth every 6 (six) hours as needed.   amLODipine  5 MG tablet Commonly known as: NORVASC  Take 1 tablet (5 mg total) by mouth daily.   amoxicillin -clavulanate 875-125 MG tablet Commonly known as: AUGMENTIN  Take 1 tablet by mouth 2 (two) times daily for 10 days.   aspirin  81 MG tablet Take 81 mg by mouth daily.   diclofenac  Sodium 1 % Gel Commonly known as: VOLTAREN  Apply 2 g topically 4 (four) times daily.   Echinacea 125 MG Caps Take 1 capsule by mouth daily.   lisinopril -hydrochlorothiazide  20-12.5 MG tablet Commonly known as: ZESTORETIC  Take 1 tablet by mouth daily.  meloxicam 15 MG tablet Commonly known as: MOBIC Take 15 mg by mouth daily.   pravastatin  20 MG tablet Commonly known as: PRAVACHOL  Take 1 tablet (20 mg total) by mouth daily.   Vitamin D  50 MCG (2000 UT) Caps Take 1 capsule by mouth daily.          Follow-up Information     Tanda Locus, MD Follow up on 07/07/2024.   Specialty: General Surgery Why: 9:00am, Arrive 30 minutes prior to your appointment time, Please bring your insurance card and photo ID Contact information: [address] [place] [address] [place]  [phone]                 Signed: Burnard Banter, [facility] 06/18/2024, 12:39 PM Please see Amion for pager number during day hours 7:00am-4:30pm, 7-11:30am on Weekends

## 2024-07-07 ENCOUNTER — Encounter: Payer: Self-pay | Admitting: General Surgery

## 2024-07-08 ENCOUNTER — Other Ambulatory Visit: Payer: Self-pay | Admitting: General Surgery

## 2024-07-09 ENCOUNTER — Other Ambulatory Visit: Payer: Self-pay | Admitting: General Surgery

## 2024-07-09 DIAGNOSIS — K37 Unspecified appendicitis: Secondary | ICD-10-CM

## 2024-07-12 ENCOUNTER — Encounter: Payer: Self-pay | Admitting: General Surgery

## 2024-07-16 ENCOUNTER — Inpatient Hospital Stay: Admission: RE | Admit: 2024-07-16 | Discharge: 2024-07-16 | Attending: General Surgery | Admitting: General Surgery

## 2024-07-16 DIAGNOSIS — K37 Unspecified appendicitis: Secondary | ICD-10-CM

## 2024-07-16 MED ORDER — IOPAMIDOL (ISOVUE-300) INJECTION 61%
100.0000 mL | Freq: Once | INTRAVENOUS | Status: AC | PRN
  Administered 2024-07-16: 100 mL via INTRAVENOUS

## 2024-07-22 NOTE — Patient Instructions (Signed)
 SURGICAL WAITING ROOM VISITATION Patients having surgery or a procedure may have no more than 2 support people in the waiting area - these visitors may rotate in the visitor waiting room.   If the patient needs to stay at the hospital during part of their recovery, the visitor guidelines for inpatient rooms apply.  PRE-OP VISITATION  Pre-op nurse will coordinate an appropriate time for 1 support person to accompany the patient in pre-op.  This support person may not rotate.  This visitor will be contacted when the time is appropriate for the visitor to come back in the pre-op area.  Temporary Visitor Restrictions   Children ages 78 and under will not be able to visit patients in [facility] under most circumstances. Visitation is not restricted outside of hospitals unless noted otherwise in the [facility] and Location Specific Visitation Guidelines at :       [email or web address]  Visitors with respiratory illnesses are discouraged from visiting and should remain at home.  You are not required to quarantine at this time prior to your surgery. However, you must do this: Hand Hygiene often Do NOT share personal items Notify your provider if you are in close contact with someone who has COVID or you develop fever 100.4 or greater, new onset of sneezing, cough, sore throat, shortness of breath or body aches.  If you test positive for Covid or have been in contact with anyone that has tested positive in the last 10 days please notify you surgeon.    Your procedure is scheduled on:  Monday  08-02-2024  Report to [facility] Main Entrance: [address] entrance where the [employer] is available.   Report to admitting at: 08:45    AM  Call this number if you have any questions or problems the morning of surgery [phone]  FOLLOW ANY ADDITIONAL PRE OP INSTRUCTIONS YOU RECEIVED FROM YOUR SURGEON'S OFFICE!!!  Dulcolax 20 mg (total) - Take 4 (four) of the 5 mg Dulcolax tablets with  water at 07:00 am the day prior to surgery.  Miralax 238 g - Mix with 64 oz Gatorade/Powerade.  Starting at 10:00 am ,Drink this gradually over the next few hours (8 oz glass every 15-30 minutes) until gone the day prior to surgery You should finish in 4 hours-6 hours.    Neomycin 1000 mg - At 2 pm, 3 pm and 10 pm after Miralax  bowel prep the day prior to surgery.  Metronidazole 1000 mg - At 2 pm, 3 pm and 10 pm after Miralax bowel prep the day prior to surgery.   Ondansetron  (Zofran ) can be used if you have any nausea from the other bowel prep medications.  Drink plenty of clear liquids all evening to avoid getting dehydrated.   DRINK two (2) bottles of Pre-Surgery Clear Ensure drink starting at 6:00 pm the evening prior to your surgery to help prevent dehydration. Increase drinking clear fluids (see list below)          Do not eat food after Midnight the night prior to your surgery/procedure.  After Midnight you may have the following liquids until    08:00 AM DAY OF SURGERY  Clear Liquid Diet Water Black Coffee (sugar ok, NO MILK/CREAM OR CREAMERS)  Tea (sugar ok, NO MILK/CREAM OR CREAMERS) regular and decaf                             Plain Jell-O  with no  fruit (NO RED)                                           Fruit ices (not with fruit pulp, NO RED)                                     Popsicles (NO RED)                                                                  Juice: NO CITRUS JUICES: only apple, WHITE grape, WHITE cranberry Sports drinks like Gatorade or Powerade (NO RED)    The day of surgery:  Drink ONE (1) Pre-Surgery Clear Ensure at 08:00  AM the morning of surgery. Drink in one sitting. Do not sip.  This drink was given to you during your hospital pre-op appointment visit. Nothing else to drink after completing the Pre-Surgery Clear Ensure : No candy, chewing gum or throat lozenges.                Oral Hygiene is also important to reduce your risk of  infection.        Remember - BRUSH YOUR TEETH THE MORNING OF SURGERY WITH YOUR REGULAR TOOTHPASTE  Do NOT smoke after Midnight the night before surgery.  STOP TAKING all Vitamins, Herbs and supplements 1 week before your surgery.   STOP TAKING ASPIRIN  1 week before surgery. Last dose:  07-26-24  Take ONLY these medicines the morning of surgery with A SIP OF WATER: Amlodipine , Tylenol ,                    You may not have any metal on your body including hair pins, jewelry, and body piercing  Do not wear make-up, lotions, powders, perfumes  or deodorant  Do not wear nail polish including gel and S&S, artificial / acrylic nails, or any other type of covering on natural nails including finger and toenails. If you have artificial nails, gel coating, etc., that needs to be removed by a nail salon, Please have this removed prior to surgery. Not doing so may mean that your surgery could be cancelled or delayed if the Surgeon or anesthesia staff feels like they are unable to monitor you safely.   Do not shave 48 hours prior to surgery to avoid nicks in your skin which may contribute to postoperative infections.   Contacts, Hearing Aids, dentures or bridgework may not be worn into surgery. DENTURES WILL BE REMOVED PRIOR TO SURGERY PLEASE DO NOT APPLY Poly grip OR ADHESIVES!!!  You may bring a small overnight bag with you on the day of surgery, only pack items that are not valuable. [facility] IS NOT RESPONSIBLE   FOR VALUABLES THAT ARE LOST OR STOLEN.   Do not bring your home medications to the hospital. The Pharmacy will dispense medications listed on your medication list to you during your admission in the Hospital.  Please read over the following fact sheets you were given: IF YOU HAVE QUESTIONS ABOUT YOUR PRE-OP INSTRUCTIONS, PLEASE CALL [phone]   [facility] - Preparing for Surgery         Before surgery, you can play an important role.  Because skin is not sterile, your skin needs  to be as free of germs as possible.  You can reduce the number of germs on your skin by washing with CHG (chlorahexidine gluconate) soap before surgery.  CHG is an antiseptic cleaner which kills germs and bonds with the skin to continue killing germs even after washing. Please DO NOT use if you have an allergy to CHG or antibacterial soaps.  If your skin becomes reddened/irritated stop using the CHG and inform your nurse when you arrive at Short Stay. Do not shave (including legs and underarms) for at least 48 hours prior to the first CHG shower.  You may shave your face/neck.  Please follow these instructions carefully:  1.  Shower with CHG Soap the night before surgery ONLY (DO NOT USE THE CHG SOAP THE MORNING OF SURGERY).  2.  If you choose to wash your hair, wash your hair first as usual with your normal  shampoo.  3.  After you shampoo, rinse your hair and body thoroughly to remove the shampoo.                             4.  Use CHG as you would any other liquid soap.  You can apply chg directly to the skin and wash.  Gently with a scrungie or clean washcloth.  5.  Apply the CHG Soap to your body ONLY FROM THE NECK DOWN.   Do not use on face/ open                           Wound or open sores. Avoid contact with eyes, ears mouth and genitals (private parts).                       Wash face,  Genitals (private parts) with your normal soap.             6.  Wash thoroughly, paying special attention to the area where your  surgery  will be performed.  7.  Thoroughly rinse your body with warm water from the neck down.  8.  DO NOT shower/wash with your normal soap after using and rinsing off the CHG Soap.                9.  Pat yourself dry with a clean towel.            10.  Wear clean pajamas.            11.  Place clean sheets on your bed the night of your first shower and do not  sleep with pets.  Day of Surgery : Do not apply any CHG, lotions/deodorants the morning of surgery.  Please wear  clean clothes to the hospital/surgery center.   FAILURE TO FOLLOW THESE INSTRUCTIONS MAY RESULT IN THE CANCELLATION OF YOUR SURGERY  PATIENT SIGNATURE_________________________________  NURSE SIGNATURE__________________________________  ________________________________________________________________________

## 2024-07-22 NOTE — Progress Notes (Addendum)
 COVID Vaccine received:  []  No [x]  Yes Date of any COVID positive Test in last 90 days: None  PCP - Glinda Like FNP  [facility]) [phone]  Cardiologist - none  Chest x-ray - 10-18-2018  2v  Epic EKG -  10-19-2018    patient says she had 1 done at PCP recently -   Requested by phone call 07-23-24 Stress Test -  ECHO - 05-23-2020  Epic Cardiac Cath -  CT Coronary Calcium score:   Pacemaker / ICD device [x]  No []  Yes   Spinal Cord Stimulator:[x]  No []  Yes       History of Sleep Apnea? [x]  No []  Yes   CPAP used?- [x]  No []  Yes    Medication on DOS:  Amlodipine , Tylenol ,   Patient has: []  NO Hx DM   [x]  Pre-DM   []  DM1  []   DM2 Does the patient monitor blood sugar?   []  N/A   [x]  No []  Yes  Last A1c was:  5.9  on  02-12-2024 in [place]               5.7 at PST on 07-23-24  Blood Thinner / Instructions: Aspirin  Instructions: ASA 81 mg  Hold x 1 wk   Activity level: Able to walk up 2 flights of stairs without becoming significantly short of breath or having chest pain?   []    Yes   [x]  No,  would have: probably some SOB  Patient can perform ADLs without assistance.  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  Patient verbalized understanding and agreement to the Pre-Surgical Instructions that were given to them at this PAT appointment. Patient was also educated of the need to review these PAT instructions again prior to her surgery.I reviewed the appropriate phone numbers to call if they have any and questions or concerns.

## 2024-07-23 ENCOUNTER — Encounter (HOSPITAL_COMMUNITY): Payer: Self-pay

## 2024-07-23 ENCOUNTER — Encounter (HOSPITAL_COMMUNITY)
Admission: RE | Admit: 2024-07-23 | Discharge: 2024-07-23 | Disposition: A | Discharge status: Home / Self Care | Source: Ambulatory Visit | Attending: General Surgery | Admitting: General Surgery

## 2024-07-23 ENCOUNTER — Ambulatory Visit: Payer: Self-pay | Admitting: General Surgery

## 2024-07-23 ENCOUNTER — Other Ambulatory Visit: Payer: Self-pay

## 2024-07-23 VITALS — BP 124/68 | HR 88 | Temp 98.7°F | Resp 16 | Ht 64.0 in | Wt 167.0 lb

## 2024-07-23 DIAGNOSIS — F329 Major depressive disorder, single episode, unspecified: Secondary | ICD-10-CM | POA: Diagnosis not present

## 2024-07-23 DIAGNOSIS — K6389 Other specified diseases of intestine: Secondary | ICD-10-CM

## 2024-07-23 DIAGNOSIS — R7303 Prediabetes: Secondary | ICD-10-CM | POA: Insufficient documentation

## 2024-07-23 DIAGNOSIS — M1990 Unspecified osteoarthritis, unspecified site: Secondary | ICD-10-CM | POA: Insufficient documentation

## 2024-07-23 DIAGNOSIS — K639 Disease of intestine, unspecified: Secondary | ICD-10-CM | POA: Insufficient documentation

## 2024-07-23 DIAGNOSIS — I10 Essential (primary) hypertension: Secondary | ICD-10-CM | POA: Insufficient documentation

## 2024-07-23 DIAGNOSIS — K219 Gastro-esophageal reflux disease without esophagitis: Secondary | ICD-10-CM | POA: Insufficient documentation

## 2024-07-23 DIAGNOSIS — F419 Anxiety disorder, unspecified: Secondary | ICD-10-CM | POA: Diagnosis not present

## 2024-07-23 DIAGNOSIS — K3532 Acute appendicitis with perforation and localized peritonitis, without abscess: Secondary | ICD-10-CM

## 2024-07-23 DIAGNOSIS — Z01818 Encounter for other preprocedural examination: Secondary | ICD-10-CM | POA: Insufficient documentation

## 2024-07-23 HISTORY — DX: Clonic hemifacial spasm, right: G51.31

## 2024-07-23 HISTORY — DX: Depression, unspecified: F32.A

## 2024-07-23 HISTORY — DX: Prediabetes: R73.03

## 2024-07-23 HISTORY — DX: Anxiety disorder, unspecified: F41.9

## 2024-07-23 HISTORY — DX: Anemia, unspecified: D64.9

## 2024-07-23 LAB — CBC WITH DIFFERENTIAL/PLATELET
Abs Immature Granulocytes: 0.06 K/uL (ref 0.00–0.07)
Basophils Absolute: 0 K/uL (ref 0.0–0.1)
Basophils Relative: 0 %
Eosinophils Absolute: 0.2 K/uL (ref 0.0–0.5)
Eosinophils Relative: 2 %
HCT: 38.3 % (ref 36.0–46.0)
Hemoglobin: 12 g/dL (ref 12.0–15.0)
Immature Granulocytes: 1 %
Lymphocytes Relative: 20 %
Lymphs Abs: 2.1 K/uL (ref 0.7–4.0)
MCH: 27.6 pg (ref 26.0–34.0)
MCHC: 31.3 g/dL (ref 30.0–36.0)
MCV: 88 fL (ref 80.0–100.0)
Monocytes Absolute: 0.7 K/uL (ref 0.1–1.0)
Monocytes Relative: 7 %
Neutro Abs: 7.3 K/uL (ref 1.7–7.7)
Neutrophils Relative %: 70 %
Platelets: 348 K/uL (ref 150–400)
RBC: 4.35 MIL/uL (ref 3.87–5.11)
RDW: 16 % — ABNORMAL HIGH (ref 11.5–15.5)
WBC: 10.4 K/uL (ref 4.0–10.5)
nRBC: 0 % (ref 0.0–0.2)

## 2024-07-23 LAB — HEMOGLOBIN A1C
Hgb A1c MFr Bld: 5.7 % — ABNORMAL HIGH (ref 4.8–5.6)
Mean Plasma Glucose: 116.89 mg/dL

## 2024-07-23 LAB — BASIC METABOLIC PANEL WITH GFR
Anion gap: 11 (ref 5–15)
BUN: 14 mg/dL (ref 8–23)
CO2: 27 mmol/L (ref 22–32)
Calcium: 9.7 mg/dL (ref 8.9–10.3)
Chloride: 107 mmol/L (ref 98–111)
Creatinine, Ser: 0.79 mg/dL (ref 0.44–1.00)
GFR, Estimated: >60 mL/min
Glucose, Bld: 109 mg/dL — ABNORMAL HIGH (ref 70–99)
Potassium: 4 mmol/L (ref 3.5–5.1)
Sodium: 145 mmol/L (ref 135–145)

## 2024-07-26 ENCOUNTER — Encounter (HOSPITAL_COMMUNITY): Payer: Self-pay

## 2024-07-26 NOTE — Anesthesia Preprocedure Evaluation (Addendum)
"                                    Anesthesia Evaluation  Patient identified by MRN, date of birth, ID band Patient awake    Reviewed: Allergy & Precautions, NPO status , Patient's Chart, lab work & pertinent test results, reviewed documented beta blocker date and time   History of Anesthesia Complications Negative for: history of anesthetic complications  Airway Mallampati: II  TM Distance: >3 FB     Dental  (+) Partial Upper   Pulmonary neg COPD   breath sounds clear to auscultation       Cardiovascular hypertension, (-) angina (-) CAD, (-) Past MI and (-) Cardiac Stents  Rhythm:Regular Rate:Normal  IMPRESSIONS     1. Left ventricular ejection fraction, by estimation, is 55 to 60%. The  left ventricle has normal function. The left ventricle has no regional  wall motion abnormalities. There is mild left ventricular hypertrophy.  Left ventricular diastolic parameters  were normal.   2. Right ventricular systolic function is normal. The right ventricular  size is normal. Tricuspid regurgitation signal is inadequate for assessing  PA pressure.   3. The mitral valve is normal in structure. No evidence of mitral valve  regurgitation. No evidence of mitral stenosis.   4. The aortic valve is tricuspid. Aortic valve regurgitation is not  visualized. No aortic stenosis is present.      Neuro/Psych neg Seizures PSYCHIATRIC DISORDERS Anxiety Depression     Neuromuscular disease    GI/Hepatic ,GERD  ,,(+) neg Cirrhosis        Endo/Other    Renal/GU Renal disease     Musculoskeletal  (+) Arthritis , Osteoarthritis,    Abdominal   Peds  Hematology  (+) Blood dyscrasia, anemia   Anesthesia Other Findings   Reproductive/Obstetrics                              Anesthesia Physical Anesthesia Plan  ASA: 2  Anesthesia Plan: General   Post-op Pain Management:    Induction: Intravenous  PONV Risk Score and Plan: 3 and  Ondansetron  and Dexamethasone   Airway Management Planned: Oral ETT  Additional Equipment:   Intra-op Plan:   Post-operative Plan: Extubation in OR  Informed Consent: I have reviewed the patients History and Physical, chart, labs and discussed the procedure including the risks, benefits and alternatives for the proposed anesthesia with the patient or authorized representative who has indicated his/her understanding and acceptance.     Dental advisory given  Plan Discussed with: CRNA  Anesthesia Plan Comments: (See PAT note from 12/26 )         Anesthesia Quick Evaluation  "
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
## 2024-08-02 ENCOUNTER — Other Ambulatory Visit: Payer: Self-pay

## 2024-08-02 ENCOUNTER — Inpatient Hospital Stay (HOSPITAL_COMMUNITY): Admitting: Certified Registered"

## 2024-08-02 ENCOUNTER — Encounter (HOSPITAL_COMMUNITY): Payer: Self-pay | Admitting: General Surgery

## 2024-08-02 ENCOUNTER — Encounter (HOSPITAL_COMMUNITY)
Admission: RE | Disposition: A | Discharge status: Home / Self Care | Payer: Self-pay | Source: Home / Self Care | Attending: General Surgery

## 2024-08-02 ENCOUNTER — Inpatient Hospital Stay (HOSPITAL_COMMUNITY): Payer: Self-pay | Admitting: Medical

## 2024-08-02 ENCOUNTER — Inpatient Hospital Stay (HOSPITAL_COMMUNITY)
Admission: RE | Admit: 2024-08-02 | Discharge: 2024-08-06 | DRG: 329 | Disposition: A | Discharge status: Home / Self Care | Attending: General Surgery | Admitting: General Surgery

## 2024-08-02 DIAGNOSIS — K3533 Acute appendicitis with perforation and localized peritonitis, with abscess: Secondary | ICD-10-CM | POA: Diagnosis present

## 2024-08-02 DIAGNOSIS — D630 Anemia in neoplastic disease: Secondary | ICD-10-CM | POA: Diagnosis present

## 2024-08-02 DIAGNOSIS — E876 Hypokalemia: Secondary | ICD-10-CM | POA: Diagnosis not present

## 2024-08-02 DIAGNOSIS — Z7952 Long term (current) use of systemic steroids: Secondary | ICD-10-CM

## 2024-08-02 DIAGNOSIS — C772 Secondary and unspecified malignant neoplasm of intra-abdominal lymph nodes: Secondary | ICD-10-CM | POA: Diagnosis present

## 2024-08-02 DIAGNOSIS — K660 Peritoneal adhesions (postprocedural) (postinfection): Secondary | ICD-10-CM

## 2024-08-02 DIAGNOSIS — Z5331 Laparoscopic surgical procedure converted to open procedure: Secondary | ICD-10-CM | POA: Diagnosis not present

## 2024-08-02 DIAGNOSIS — M1990 Unspecified osteoarthritis, unspecified site: Secondary | ICD-10-CM | POA: Diagnosis present

## 2024-08-02 DIAGNOSIS — C181 Malignant neoplasm of appendix: Secondary | ICD-10-CM | POA: Diagnosis present

## 2024-08-02 DIAGNOSIS — Z7982 Long term (current) use of aspirin: Secondary | ICD-10-CM | POA: Diagnosis not present

## 2024-08-02 DIAGNOSIS — I10 Essential (primary) hypertension: Secondary | ICD-10-CM | POA: Diagnosis present

## 2024-08-02 DIAGNOSIS — Z9049 Acquired absence of other specified parts of digestive tract: Principal | ICD-10-CM

## 2024-08-02 DIAGNOSIS — K219 Gastro-esophageal reflux disease without esophagitis: Secondary | ICD-10-CM | POA: Diagnosis present

## 2024-08-02 HISTORY — PX: LAPAROSCOPIC RIGHT COLECTOMY: SHX5925

## 2024-08-02 LAB — TYPE AND SCREEN
ABO/RH(D): B POS
Antibody Screen: NEGATIVE

## 2024-08-02 LAB — ABO/RH: ABO/RH(D): B POS

## 2024-08-02 SURGERY — COLECTOMY, RIGHT, LAPAROSCOPIC
Anesthesia: General | Laterality: Right

## 2024-08-02 MED ORDER — ROCURONIUM BROMIDE 10 MG/ML (PF) SYRINGE
PREFILLED_SYRINGE | INTRAVENOUS | Status: AC
  Filled 2024-08-02: qty 10

## 2024-08-02 MED ORDER — BUPIVACAINE-EPINEPHRINE 0.25% -1:200000 IJ SOLN
INTRAMUSCULAR | Status: DC | PRN
  Administered 2024-08-02: 15 mL

## 2024-08-02 MED ORDER — ONDANSETRON HCL 4 MG PO TABS: 4.0000 mg | ORAL_TABLET | Freq: Four times a day (QID) | ORAL | Status: DC | PRN

## 2024-08-02 MED ORDER — STERILE WATER FOR IRRIGATION IR SOLN
Status: DC | PRN
  Administered 2024-08-02: 1000 mL

## 2024-08-02 MED ORDER — ORAL CARE MOUTH RINSE: 15.0000 mL | Freq: Once | OROMUCOSAL | Status: AC

## 2024-08-02 MED ORDER — ENSURE PRE-SURGERY PO LIQD: 296.0000 mL | Freq: Once | ORAL | Status: DC

## 2024-08-02 MED ORDER — FENTANYL CITRATE (PF) 50 MCG/ML IJ SOSY
PREFILLED_SYRINGE | INTRAMUSCULAR | Status: AC
  Filled 2024-08-02: qty 3

## 2024-08-02 MED ORDER — DEXAMETHASONE SOD PHOSPHATE PF 10 MG/ML IJ SOLN
INTRAMUSCULAR | Status: DC | PRN
  Administered 2024-08-02: 10 mg via INTRAVENOUS

## 2024-08-02 MED ORDER — MORPHINE SULFATE (PF) 2 MG/ML IV SOLN
1.0000 mg | INTRAVENOUS | Status: DC | PRN
  Administered 2024-08-02: 2 mg via INTRAVENOUS
  Administered 2024-08-02: 1 mg via INTRAVENOUS
  Filled 2024-08-02 (×2): qty 1

## 2024-08-02 MED ORDER — OXYCODONE HCL 5 MG PO TABS
ORAL_TABLET | ORAL | Status: AC
  Filled 2024-08-02: qty 1

## 2024-08-02 MED ORDER — MIDAZOLAM HCL (PF) 2 MG/2ML IJ SOLN
INTRAMUSCULAR | Status: DC | PRN
  Administered 2024-08-02: 2 mg via INTRAVENOUS

## 2024-08-02 MED ORDER — LIDOCAINE HCL (PF) 2 % IJ SOLN
INTRAMUSCULAR | Status: AC
  Filled 2024-08-02: qty 5

## 2024-08-02 MED ORDER — CHLORHEXIDINE GLUCONATE 0.12 % MT SOLN
15.0000 mL | Freq: Once | OROMUCOSAL | Status: AC
  Administered 2024-08-02: 15 mL via OROMUCOSAL

## 2024-08-02 MED ORDER — OXYCODONE HCL 5 MG PO TABS
5.0000 mg | ORAL_TABLET | Freq: Once | ORAL | Status: AC | PRN
  Administered 2024-08-02: 5 mg via ORAL

## 2024-08-02 MED ORDER — HYDROMORPHONE HCL 2 MG/ML IJ SOLN
INTRAMUSCULAR | Status: AC
  Filled 2024-08-02: qty 1

## 2024-08-02 MED ORDER — HYDROMORPHONE HCL 1 MG/ML IJ SOLN
INTRAMUSCULAR | Status: DC | PRN
  Administered 2024-08-02: 1 mg via INTRAVENOUS

## 2024-08-02 MED ORDER — SODIUM CHLORIDE 0.9 % IV SOLN
2.0000 g | INTRAVENOUS | Status: AC
  Administered 2024-08-02: 2 g via INTRAVENOUS
  Filled 2024-08-02: qty 2

## 2024-08-02 MED ORDER — OXYCODONE HCL 5 MG PO TABS
5.0000 mg | ORAL_TABLET | ORAL | Status: DC | PRN
  Administered 2024-08-03 – 2024-08-05 (×3): 5 mg via ORAL
  Administered 2024-08-06: 10 mg via ORAL
  Filled 2024-08-02 (×2): qty 1
  Filled 2024-08-02: qty 2
  Filled 2024-08-02: qty 1

## 2024-08-02 MED ORDER — SUGAMMADEX SODIUM 200 MG/2ML IV SOLN
INTRAVENOUS | Status: AC
  Filled 2024-08-02: qty 2

## 2024-08-02 MED ORDER — KCL IN DEXTROSE-NACL 20-5-0.45 MEQ/L-%-% IV SOLN
INTRAVENOUS | Status: AC
  Filled 2024-08-02 (×4): qty 1000

## 2024-08-02 MED ORDER — MIDAZOLAM HCL 2 MG/2ML IJ SOLN
INTRAMUSCULAR | Status: AC
  Filled 2024-08-02: qty 2

## 2024-08-02 MED ORDER — ROCURONIUM 10MG/ML (10ML) SYRINGE FOR MEDFUSION PUMP - OPTIME
INTRAVENOUS | Status: DC | PRN
  Administered 2024-08-02: 10 mg via INTRAVENOUS
  Administered 2024-08-02: 60 mg via INTRAVENOUS
  Administered 2024-08-02: 10 mg via INTRAVENOUS

## 2024-08-02 MED ORDER — PROPOFOL 10 MG/ML IV BOLUS
INTRAVENOUS | Status: DC | PRN
  Administered 2024-08-02: 120 mg via INTRAVENOUS

## 2024-08-02 MED ORDER — ONDANSETRON HCL 4 MG/2ML IJ SOLN: 4.0000 mg | Freq: Once | INTRAMUSCULAR | Status: DC | PRN

## 2024-08-02 MED ORDER — SUGAMMADEX SODIUM 200 MG/2ML IV SOLN
INTRAVENOUS | Status: DC | PRN
  Administered 2024-08-02: 200 mg via INTRAVENOUS

## 2024-08-02 MED ORDER — ONDANSETRON HCL 4 MG/2ML IJ SOLN
4.0000 mg | Freq: Four times a day (QID) | INTRAMUSCULAR | Status: DC | PRN
  Administered 2024-08-03: 4 mg via INTRAVENOUS
  Filled 2024-08-02: qty 2

## 2024-08-02 MED ORDER — ALVIMOPAN 12 MG PO CAPS
12.0000 mg | ORAL_CAPSULE | ORAL | Status: AC
  Administered 2024-08-02: 12 mg via ORAL
  Filled 2024-08-02: qty 1

## 2024-08-02 MED ORDER — HEPARIN SODIUM (PORCINE) 5000 UNIT/ML IJ SOLN
5000.0000 [IU] | Freq: Once | INTRAMUSCULAR | Status: AC
  Administered 2024-08-02: 5000 [IU] via SUBCUTANEOUS
  Filled 2024-08-02: qty 1

## 2024-08-02 MED ORDER — FENTANYL CITRATE (PF) 50 MCG/ML IJ SOSY
25.0000 ug | PREFILLED_SYRINGE | INTRAMUSCULAR | Status: DC | PRN
  Administered 2024-08-02 (×3): 50 ug via INTRAVENOUS

## 2024-08-02 MED ORDER — ENOXAPARIN SODIUM 40 MG/0.4ML IJ SOSY
40.0000 mg | PREFILLED_SYRINGE | INTRAMUSCULAR | Status: DC
  Administered 2024-08-03 – 2024-08-06 (×4): 40 mg via SUBCUTANEOUS
  Filled 2024-08-02 (×4): qty 0.4

## 2024-08-02 MED ORDER — LIDOCAINE 2% (20 MG/ML) 5 ML SYRINGE
INTRAMUSCULAR | Status: DC | PRN
  Administered 2024-08-02: 100 mg via INTRAVENOUS

## 2024-08-02 MED ORDER — ACETAMINOPHEN 500 MG PO TABS
1000.0000 mg | ORAL_TABLET | ORAL | Status: AC
  Administered 2024-08-02: 1000 mg via ORAL
  Filled 2024-08-02: qty 2

## 2024-08-02 MED ORDER — SIMETHICONE 80 MG PO CHEW: 40.0000 mg | CHEWABLE_TABLET | Freq: Four times a day (QID) | ORAL | Status: DC | PRN

## 2024-08-02 MED ORDER — 0.9 % SODIUM CHLORIDE (POUR BTL) OPTIME
TOPICAL | Status: DC | PRN
  Administered 2024-08-02: 2000 mL
  Administered 2024-08-02: 1000 mL

## 2024-08-02 MED ORDER — ACETAMINOPHEN 500 MG PO TABS
1000.0000 mg | ORAL_TABLET | Freq: Four times a day (QID) | ORAL | Status: DC
  Administered 2024-08-02 – 2024-08-06 (×15): 1000 mg via ORAL
  Filled 2024-08-02 (×15): qty 2

## 2024-08-02 MED ORDER — MELATONIN 3 MG PO TABS: 3.0000 mg | ORAL_TABLET | Freq: Every evening | ORAL | Status: DC | PRN

## 2024-08-02 MED ORDER — ONDANSETRON HCL 4 MG/2ML IJ SOLN
INTRAMUSCULAR | Status: AC
  Filled 2024-08-02: qty 2

## 2024-08-02 MED ORDER — OXYCODONE HCL 5 MG/5ML PO SOLN: 5.0000 mg | Freq: Once | ORAL | Status: AC | PRN

## 2024-08-02 MED ORDER — BUPIVACAINE-EPINEPHRINE (PF) 0.25% -1:200000 IJ SOLN
INTRAMUSCULAR | Status: AC
  Filled 2024-08-02: qty 30

## 2024-08-02 MED ORDER — ENSURE SURGERY PO LIQD
237.0000 mL | Freq: Two times a day (BID) | ORAL | Status: DC
  Administered 2024-08-04 – 2024-08-05 (×3): 237 mL via ORAL

## 2024-08-02 MED ORDER — PROPOFOL 10 MG/ML IV BOLUS
INTRAVENOUS | Status: AC
  Filled 2024-08-02: qty 20

## 2024-08-02 MED ORDER — DIPHENHYDRAMINE HCL 12.5 MG/5ML PO ELIX: 12.5000 mg | ORAL_SOLUTION | Freq: Four times a day (QID) | ORAL | Status: DC | PRN

## 2024-08-02 MED ORDER — ONDANSETRON HCL 4 MG/2ML IJ SOLN
INTRAMUSCULAR | Status: DC | PRN
  Administered 2024-08-02: 4 mg via INTRAVENOUS

## 2024-08-02 MED ORDER — CHLORHEXIDINE GLUCONATE CLOTH 2 % EX PADS: 6.0000 | MEDICATED_PAD | Freq: Once | CUTANEOUS | Status: DC

## 2024-08-02 MED ORDER — ACETAMINOPHEN 10 MG/ML IV SOLN: 1000.0000 mg | Freq: Once | INTRAVENOUS | Status: DC | PRN

## 2024-08-02 MED ORDER — FENTANYL CITRATE (PF) 250 MCG/5ML IJ SOLN
INTRAMUSCULAR | Status: DC | PRN
  Administered 2024-08-02: 100 ug via INTRAVENOUS
  Administered 2024-08-02 (×3): 50 ug via INTRAVENOUS

## 2024-08-02 MED ORDER — FENTANYL CITRATE (PF) 250 MCG/5ML IJ SOLN
INTRAMUSCULAR | Status: AC
  Filled 2024-08-02: qty 5

## 2024-08-02 MED ORDER — ENSURE PRE-SURGERY PO LIQD: 592.0000 mL | Freq: Once | ORAL | Status: DC

## 2024-08-02 MED ORDER — PHENYLEPHRINE HCL (PRESSORS) 10 MG/ML IV SOLN
INTRAVENOUS | Status: DC | PRN
  Administered 2024-08-02 (×2): 80 ug via INTRAVENOUS
  Administered 2024-08-02: 160 ug via INTRAVENOUS

## 2024-08-02 MED ORDER — LACTATED RINGERS IV SOLN: INTRAVENOUS | Status: DC

## 2024-08-02 MED ORDER — ALVIMOPAN 12 MG PO CAPS
12.0000 mg | ORAL_CAPSULE | Freq: Two times a day (BID) | ORAL | Status: DC
  Administered 2024-08-03 (×2): 12 mg via ORAL
  Filled 2024-08-02 (×5): qty 1

## 2024-08-02 MED ORDER — DIPHENHYDRAMINE HCL 50 MG/ML IJ SOLN: 12.5000 mg | Freq: Four times a day (QID) | INTRAMUSCULAR | Status: DC | PRN

## 2024-08-02 SURGICAL SUPPLY — 59 items
BAG COUNTER SPONGE SURGICOUNT (BAG) IMPLANT
BLADE EXTENDED COATED 6.5IN (ELECTRODE) IMPLANT
CHLORAPREP W/TINT 26 (MISCELLANEOUS) ×1 IMPLANT
CLIP APPLIE 5 13 M/L LIGAMAX5 (MISCELLANEOUS) IMPLANT
CLIP APPLIE ROT 10 11.4 M/L (STAPLE) IMPLANT
COUNTER NDL 20CT MAGNET RED (NEEDLE) ×1 IMPLANT
COVER MAYO STAND STRL (DRAPES) ×3 IMPLANT
DRAIN CHANNEL 19F RND (DRAIN) IMPLANT
DRAPE LAPAROSCOPIC ABDOMINAL (DRAPES) ×1 IMPLANT
DRSG OPSITE POSTOP 4X6 (GAUZE/BANDAGES/DRESSINGS) IMPLANT
DRSG OPSITE POSTOP 4X8 (GAUZE/BANDAGES/DRESSINGS) IMPLANT
DRSG TEGADERM 2-3/8X2-3/4 SM (GAUZE/BANDAGES/DRESSINGS) IMPLANT
DRSG TELFA 3X8 NADH STRL (GAUZE/BANDAGES/DRESSINGS) IMPLANT
ELECT REM PT RETURN 15FT ADLT (MISCELLANEOUS) ×1 IMPLANT
EVACUATOR SILICONE 100CC (DRAIN) IMPLANT
GAUZE SPONGE 4X4 12PLY STRL (GAUZE/BANDAGES/DRESSINGS) IMPLANT
GLOVE BIO SURGEON STRL SZ7.5 (GLOVE) ×1 IMPLANT
GLOVE INDICATOR 8.0 STRL GRN (GLOVE) ×1 IMPLANT
GOWN STRL REUS W/ TWL XL LVL3 (GOWN DISPOSABLE) ×2 IMPLANT
IRRIGATION SUCT STRKRFLW 2 WTP (MISCELLANEOUS) ×1 IMPLANT
KIT TURNOVER KIT A (KITS) ×1 IMPLANT
LEGGING LITHOTOMY PAIR STRL (DRAPES) IMPLANT
LIGASURE IMPACT 36 18CM CVD LR (INSTRUMENTS) IMPLANT
PACK COLON (CUSTOM PROCEDURE TRAY) ×1 IMPLANT
PAD POSITIONING PINK XL (MISCELLANEOUS) IMPLANT
PENCIL SMOKE EVACUATOR (MISCELLANEOUS) IMPLANT
PROTECTOR NERVE ULNAR (MISCELLANEOUS) IMPLANT
RELOAD STAPLE 60 BLU REG PROX (ENDOMECHANICALS) IMPLANT
RELOAD STAPLE 75 3.8 BLU REG (ENDOMECHANICALS) IMPLANT
RETRACTOR WND ALEXIS 18 MED (MISCELLANEOUS) IMPLANT
SCISSORS LAP 5X35 DISP (ENDOMECHANICALS) IMPLANT
SEPRAFILM MEMBRANE 5X6 (MISCELLANEOUS) IMPLANT
SEPRAFILM PROCEDURAL PACK 3X5 (MISCELLANEOUS) IMPLANT
SET TUBE SMOKE EVAC HIGH FLOW (TUBING) ×1 IMPLANT
SHEARS HARMONIC 36 ACE (MISCELLANEOUS) ×1 IMPLANT
SLEEVE Z-THREAD 5X100MM (TROCAR) ×2 IMPLANT
STAPLER GUN LINEAR PROX 60 (STAPLE) IMPLANT
STAPLER PROXIMATE 75MM BLUE (STAPLE) IMPLANT
STAPLER SKIN PROX 35W (STAPLE) ×1 IMPLANT
SUT ETHILON 2 0 PS N (SUTURE) IMPLANT
SUT MNCRL AB 4-0 PS2 18 (SUTURE) IMPLANT
SUT PDS AB 0 CTX 60 (SUTURE) IMPLANT
SUT PDS AB 1 CT1 27 (SUTURE) IMPLANT
SUT PDS AB 1 TP1 96 (SUTURE) IMPLANT
SUT SILK 2 0 SH CR/8 (SUTURE) ×1 IMPLANT
SUT SILK 2-0 18XBRD TIE 12 (SUTURE) ×1 IMPLANT
SUT SILK 3 0 SH CR/8 (SUTURE) ×1 IMPLANT
SUT SILK 3-0 18XBRD TIE 12 (SUTURE) ×1 IMPLANT
SUT VIC AB 1 CTX 18 (SUTURE) IMPLANT
SUT VIC AB 3-0 SH 18 (SUTURE) IMPLANT
SYSTEM LAPSCP GELPORT 120MM (MISCELLANEOUS) IMPLANT
SYSTEM WOUND ALEXIS 18CM MED (MISCELLANEOUS) IMPLANT
TAPE UMBILICAL 1/8 X36 TWILL (MISCELLANEOUS) IMPLANT
TOWEL OR DSP ST BLU DLX 10/PK (DISPOSABLE) IMPLANT
TRAY FOLEY MTR SLVR 16FR STAT (SET/KITS/TRAYS/PACK) IMPLANT
TROCAR 11X100 Z THREAD (TROCAR) IMPLANT
TROCAR ADV FIXATION 12X100MM (TROCAR) IMPLANT
TROCAR XCEL NON-BLD 5MMX100MML (ENDOMECHANICALS) IMPLANT
TROCAR Z-THREAD OPTICAL 5X100M (TROCAR) ×1 IMPLANT

## 2024-08-02 NOTE — H&P (Signed)
 REFERRING PHYSICIAN: Daye, Deneda, NP  PROVIDER: Pierra Skora Julie BLUSH, MD  MRN: [id number] DOB: 04/25/1956 DATE OF ENCOUNTER: 07/07/2024  Subjective   Chief Complaint: Appendicitis (F/u from hospital)   Reason for consult: Julie Greene is a 69 y.o. female who is seen today as an office consultation at the request of Dr. Rik for evaluation of Appendicitis (F/u from hospital) .   History of Present Illness Julie Greene is a 69 year old female who presents for follow-up after medical management of appendicitis with abscess. SHe was in the hospital from November 17 through 21. She completed her antibiotics after discharge  She feels significantly better and has no current abdominal pain. She had one episode of diarrhea and vomiting after consuming spicy food, but this was isolated. No fever, chills, or further episodes of blood in her stool have occurred since her hospital stay.  Her energy levels are described as 'so-so,' which she attributes to stiffness in her legs due to arthritis. Her appetite is okay, but she is cautious about her diet to avoid upsetting her digestive system.  No fever, chills, abdominal pain, or blood in her stool. She reports one episode of diarrhea and vomiting after eating spicy food.    Review of Systems: A complete review of systems was obtained from the patient. I have reviewed this information and discussed as appropriate with the patient. See HPI as well for other ROS.  ROS   Medical History: Past Medical History:  Diagnosis Date  History of headache  Hypertension  Long term current use of systemic steroids  Vision abnormalities   There is no problem list on file for this patient.  History reviewed. No pertinent surgical history.   Allergies  Allergen Reactions  Sulfa (Sulfonamide Antibiotics) Unknown and Hives  REACTION: hives   Current Outpatient Medications on File Prior to Visit  Medication Sig Dispense Refill  amLODIPine  (NORVASC )  5 MG tablet Take 5 mg by mouth once daily  aspirin  81 mg Cap Take by mouth  pravastatin  (PRAVACHOL ) 20 MG tablet  aspirin -calcium carbonate 81 mg-300 mg calcium(777 mg) Tab Take by mouth (Patient not taking: Reported on 07/07/2024)  cholecalciferol (VITAMIN D3) 2,000 unit capsule Take by mouth (Patient not taking: Reported on 07/07/2024)  diclofenac  (VOLTAREN ) 75 MG EC tablet diclofenac  sodium 75 mg tablet,delayed release  KLOR-CON  M20 20 mEq ER tablet  lisinopriL -hydrochlorothiazide  (ZESTORETIC ) 20-12.5 mg tablet Take 1 tablet by mouth once daily  omega-3 fatty acids-fish oil 300-1,000 mg capsule Take by mouth (Patient not taking: Reported on 07/07/2024)  traMADoL  (ULTRAM ) 50 mg tablet tramadol  50 mg tablet  valACYclovir  (VALTREX ) 500 MG tablet Take 500 mg by mouth once daily   No current facility-administered medications on file prior to visit.   Family History  Problem Relation Age of Onset  No Known Problems Mother  No Known Problems Father  No Known Problems Sister  No Known Problems Brother  No Known Problems Maternal Aunt  No Known Problems Maternal Uncle  No Known Problems Paternal Aunt  No Known Problems Paternal Uncle  No Known Problems Maternal Grandmother  No Known Problems Maternal Grandfather  No Known Problems Paternal Grandmother  No Known Problems Paternal Grandfather  No Known Problems Other  Allergic rhinitis Neg Hx  Amblyopia Neg Hx  Ankylosing spondylitis Neg Hx  Atrial fibrillation (Abnormal heart rhythm sometimes requiring treatment with blood thinners) Neg Hx  Basal cell carcinoma Neg Hx  Blindness Neg Hx  Cataracts Neg Hx  Coronary Artery Disease (Blocked arteries around  heart) Neg Hx  Diabetes Neg Hx  Diabetes type I Neg Hx  Diabetes type II Neg Hx  Glaucoma Neg Hx  Graves' disease Neg Hx  Hashimoto's thyroiditis Neg Hx  High blood pressure (Hypertension) Neg Hx  Hyperthyroidism Neg Hx  Hypothyroidism Neg Hx  Macular degeneration Neg Hx   Melanoma Neg Hx  Neuropathy Neg Hx  Renal Insufficiency Neg Hx  Retinal degeneration Neg Hx  Retinopathy of prematurity Neg Hx  Rheum arthritis Neg Hx  Sarcoidosis Neg Hx  Sinusitis Neg Hx  Sleep apnea Neg Hx  Strabismus Neg Hx  Thyroid disease Neg Hx  Vision loss Neg Hx    Social History   Tobacco Use  Smoking Status Never  Smokeless Tobacco Never    Social History   Socioeconomic History  Marital status: Single  Tobacco Use  Smoking status: Never  Smokeless tobacco: Never  Substance and Sexual Activity  Drug use: Never   Social Drivers of Health   Food Insecurity: No Food Insecurity (06/14/2024)  Received from [facility]  Hunger Vital Sign  Within the past 12 months, you worried that your food would run out before you got the money to buy more.: Never true  Within the past 12 months, the food you bought just didn't last and you didn't have money to get more.: Never true  Transportation Needs: No Transportation Needs (06/14/2024)  Received from [facility] - Transportation  In the past 12 months, has lack of transportation kept you from medical appointments or from getting medications?: No  In the past 12 months, has lack of transportation kept you from meetings, work, or from getting things needed for daily living?: No  Social Connections: Moderately Isolated (06/14/2024)  Received from [facility]  Social Connection and Isolation Panel  In a typical week, how many times do you talk on the phone with family, friends, or neighbors?: Three times a week  How often do you get together with friends or relatives?: Once a week  How often do you attend church or religious services?: More than 4 times per year  Do you belong to any clubs or organizations such as church groups, unions, fraternal or athletic groups, or school groups?: No  How often do you attend meetings of the clubs or organizations you belong to?: Never  Are you married, widowed, divorced,  separated, never married, or living with a partner?: Never married  Housing Stability: Unknown (07/07/2024)  Housing Stability Vital Sign  Homeless in the Last Year: No   Objective:   Vitals:  07/07/24 0911  BP: 138/78  Pulse: 102  Temp: 36.6 C (97.9 F)  SpO2: 97%  Weight: 76.9 kg (169 lb 9.6 oz)  Height: 162.6 cm (5' 4)  PainSc: 0-No pain   Body mass index is 29.11 kg/m.  PE Chaperone note: No sensitive exam performed  Constitutional: NAD; conversant; no deformities Eyes: Moist conjunctiva; no lid lag; anicteric; PERRL Neck: Trachea midline; no thyromegaly Lungs: Normal respiratory effort; no tactile fremitus CV: RRR; no palpable thrills; no pitting edema GI: Abd soft, nondistended, very subtle tenderness to deep palpation in the right lower quadrant, no guarding or rebound or peritonitis; no palpable hepatosplenomegaly MSK: Normal gait; no clubbing/cyanosis Psychiatric: Appropriate affect; alert and oriented x3 Lymphatic: No palpable cervical or axillary lymphadenopathy Skin: No rash, lesions  Labs, Imaging and Diagnostic Testing: CT 06/14/24 FINDINGS: Lower chest: Bibasilar subsegmental volume loss. Heart is at the upper limits of normal in size to mildly enlarged. No  pericardial or pleural effusion. Distal esophagus is grossly unremarkable.  Hepatobiliary: Tiny left hepatic lobe cyst. No specific follow-up necessary. Liver and gallbladder are otherwise unremarkable. No biliary ductal dilatation.  Pancreas: Negative.  Spleen: Negative.  Adrenals/Urinary Tract: Adrenal glands are unremarkable. Low-attenuation lesions in the kidneys, the majority of which are too small to characterize. No specific follow-up necessary. Kidneys are otherwise unremarkable. Ureters are decompressed. Bladder is grossly unremarkable.  Stomach/Bowel: Tiny hiatal hernia. Stomach and small bowel are unremarkable. There is a complex thick-walled fluid collection in the right  ileocolic mesentery, measuring 6.0 x 6.5 cm (2/62). The appendix is not seen separately. Surrounding inflammatory haziness and stranding. No extraluminal air. Small reactive ileocolic mesenteric lymph nodes. Mild secondary inflammatory hyperattenuation of the distal and terminal ileum. Colon is unremarkable.  Vascular/Lymphatic: Atherosclerotic calcification of the aorta. No pathologically enlarged lymph nodes.  Reproductive: Uterus is visualized. No adnexal mass.  Other: Trace pelvic free fluid.  Musculoskeletal: Degenerative changes in the spine.  IMPRESSION: 1. Appendicitis with a large abscess. 2. Aortic atherosclerosis (ICD10-I70.0).  Hospital discharge summary from June 18, 2024  Assessment and Plan:    Diagnoses and all orders for this visit:  Appendicitis with abscess    Assessment & Plan Acute appendicitis with perforation and abscess Managed medically in mid-November. Currently asymptomatic with no abdominal pain, fever, or chills. Previous episodes of diarrhea and vomiting after consuming spicy foods. No recent hematochezia. Mild tenderness on abdominal examination. Differential includes potential underlying neoplasm due to age and presentation. - Ordered repeat CT scan of abdomen and pelvis to assess resolution of abscess and inflammation in the right lower quadrant. - Discussed surgical options including interval appendectomy and potential need for colonoscopy if surgery is not pursued.  Risk of recurrent appendicitis and underlying neoplasm Risk of recurrent appendicitis is 15-30% within the next year. Risk of underlying neoplasm causing appendicitis is 10-15% in individuals of her age. Discussed two long-term management options: interval appendectomy or colonoscopy. Interval appendectomy involves laparoscopic removal of the appendix, with potential conversion to open surgery if inflammation is severe. Risks include bleeding, infection, hernia, and anastomotic  leak if partial colon resection is needed. Colonoscopy is an alternative to rule out underlying neoplasm if surgery is not pursued. She prefers interval appendectomy to avoid recurrent appendicitis and potential complications. - Ordered repeat CT scan to guide surgical planning. - Discussed interval appendectomy as preferred option to prevent recurrence and rule out underlying neoplasm. - Will schedule interval appendectomy post-CT scan results, with potential for open surgery if inflammation is severe. - Will discuss colonoscopy as alternative if surgery is not pursued.  I discussed the steps of an appendectomy with potential conversion to partial colectomy depending on intraoperative findings such as dense inflammation. I discussed the steps of the procedure. We discussed the typical hospitalization if a partial colectomy is performed. But hopefully we can avoid that. We discussed the risk of an appendectomy including but not limited to bleeding, infection, injury to surrounding structures, need to convert to an open procedure, staple line leak, abscess, perioperative cardiac and pulmonary events, blood clot formation. We also discussed the risk of a partial colectomy which are similar to above such as bleeding, infection, anastomotic leak, incisional hernia, wound infection, injury to surrounding structures, perioperative cardiac and pulmonary events, blood clot formation. We discussed the typical recovery.  This patient encounter took 42 minutes today to perform the following: take history, perform exam, review outside records, interpret imaging, counsel the patient on their diagnosis and document  encounter, findings & plan in the EHR  Return if symptoms worsen or fail to improve.  This note has been created using automated tools and reviewed for accuracy by Julie Greene.  Addendum August 02, 2024 The patient had a follow-up CT on December 19.  Unfortunately her follow-up CT essentially  showed a persistent abnormal appearing cecum and appendix area with cystic component concerning more for a carcinoma or mucocele as opposed to ruptured appendicitis with abscess.  Because the area had not improved after a course of antibiotics radiology was more concern for this being an underlying neoplasm.  I contacted the patient and discussed the results with her on the phone at this point I recommended that we no longer try to do a interval appendectomy that we instead do a partial colectomy which we briefly discussed at her visit in December as well as during her prior hospitalization.  During that phone conversation I discussed the steps of the procedure along with the risk and benefits  I discussed the procedure in detail.    We discussed the risks and benefits of surgery including, but not limited to bleeding, infection (such as wound infection, abdominal abscess), injury to surrounding structures, blood clot formation, urinary retention, incisional hernia, anastomotic stricture, anastomotic leak, anesthesia risks, pulmonary & cardiac complications such as pneumonia &/or heart attack, need for additional procedures, ileus, & prolonged hospitalization.  We discussed the typical postoperative recovery course, including limitations & restrictions postoperatively. I explained that the likelihood of improvement in their symptoms is good.   Deidra Spease Julie BLUSH, MD  General, Minimally Invasive, & Bariatric Surgery

## 2024-08-02 NOTE — Plan of Care (Signed)
   Problem: Activity: Goal: Ability to tolerate increased activity will improve Outcome: Progressing

## 2024-08-02 NOTE — Op Note (Addendum)
 08/02/2024  2:27 PM  PATIENT:  Julie Greene  69 y.o. female  PRE-OPERATIVE DIAGNOSIS:  history perforated appendicitis vs possible perforated cecal mass  POST-OPERATIVE DIAGNOSIS: perforated appendicitis/appendiceal mass vs perforated cecal mass  PROCEDURE:  Procedures: 1) Laparoscopic assisted right colectomy converted to open 2) SMALL BOWEL RESECTION  SURGEON:  Surgeon(s): Tanda Locus, MD   ASSISTANTS: Signe Mitzie DELENA, MD   ANESTHESIA:   general  EBL: <100 mL  DRAINS: Urinary Catheter (Foley)   LOCAL MEDICATIONS USED:  MARCAINE    SPECIMEN:  Source of Specimen:  right colon with TI and the separately resected segment of small bowel was left attached to the cecum  DISPOSITION OF SPECIMEN:  PATHOLOGY  COUNTS:  YES  INDICATION FOR PROCEDURE: Patient is a 68 year old female who had presented back in mid November with several days of abdominal pain.  Her initial CT showed a complex thick-walled fluid collection in the right ileocolonic mesentery measuring 6 x 6.5 cm.  The appendix was not seen separately.  There was surrounding inflammatory changes and there was no extraluminal air.  Radiology felt it was most consistent with appendicitis with abscess.  Given the location of the abscess it was not amendable to percutaneous drainage.  She was medically managed with IV antibiotics.  Her pain improved her white count normalized and she was discharged home.  We discussed management of ruptured appendicitis in a senior citizen.  We discussed the risk of recurrent appendicitis, the risk of underlying malignancy.  We discussed pros and cons of interval appendectomy versus colonoscopic evaluation and long-term nonoperative management.  We arranged a follow-up CT scan since the patient was interested in a interval appendectomy.  Her CT scan was done about a month after her initial presentation.  Unfortunately the right lower quadrant essentially appeared unchanged.  There was still a thick  walled complex cystic structure in the right lower quadrant involving the cecum.  The appendix was visualized but then appendiceal diameter of 12 mm and distended.  Because essentially there was no change in the CT scan this raise the concern for an underlying malignancy.  Recommended that the patient proceed with partial colectomy at this point.  My partners and I did not believe a preoperative colonoscopy would change the ultimate management since the patient would need a partial colectomy.  Risk and benefits were discussed in detail with the patient and separately documented.  PROCEDURE: The patient underwent a preoperative bowel prep.  She received subcu heparin  this morning along with Entereg .  She received IV antibiotic preoperatively.  After obtaining informed consent the patient was taken to operating room [facility] at [facility] and placed upon on the operating room table.  General endotracheal anesthesia was established.  Sequential compression devices were placed.  A Foley catheter was placed.  Her left arm was tucked with the appropriate padding.  Her abdomen was prepped and draped in usual standard surgical fashion with ChloraPrep.  A surgical timeout was performed.  Access to the abdomen was obtained in the left upper quadrant at Palmer's point.  A small incision was made.  Then using a 0 degree 5 mm laparoscope through a 5 mm trocar was advanced through all layers of the abdominal wall and carefully entered the abdominal cavity.  Pneumoperitoneum was smoothly established up to a patient pressure of 15 mmHg.  No change in patient vital signs.  No evidence of injury to surrounding structures.  The patient was placed in Trendelenburg and rotated to the left.  An additional 5 mm trocar was placed in the infraumbilical position and another 1 in the left lower quadrant.  The patient had omentum adhered to the right side of the abdomen and to the ascending colon.  I was able to visualize the cecum.   There was no free-floating pus or purulent fluid in the abdomen.  There was probably about a 2 inch section of small bowel that was densely adhered to the medial portion of the cecum.  This section of small bowel was not to the terminal ileum.  This section was about 45 cm from the terminal ileum.  The cecum was densely adhered and fixated to the right lower quadrant lateral abdominal wall.  There was also what appeared to be the terminal ileum also densely adhered to the right lower quadrant lateral abdominal wall.  The ascending colon was not tethered and just had its typical lateral attachments.  The patient had somewhat of a droopy transverse colon.  I went about taking the omentum off the right lateral abdominal wall and off the ascending colon with harmonic scalpel.  I then started taking down some of the lateral attachments of the ascending colon with harmonic scalpel trying to medialize the ascending colon.  I worked up toward the hepatic flexure with a combination of blunt dissection along with harmonic scalpel.  We then placed the patient in reverse Trendelenburg.  There is also omentum tethering to the gallbladder.  This was taken down with harmonic scalpel.  I lifted the omentum up.  Started taking down some of the omentum off the proximal transverse colon.  At this time my assistant placed a 5 mm trocar in the right lateral abdominal wall to facilitate with retraction.  We completely mobilized and took down the hepatic flexure in a sequential fashion using harmonic scalpel.  We stayed anterior to Gerota's fascia.  We identified the duodenum and stayed anterior to that.  At this point we felt that we had mobilized the hepatic flexure and transverse colon and returned to the right lower quadrant.  Using a pair EndoShears I started to try to mobilize some of the cecum which was essentially densely adhered and essentially plastered to the right lower quadrant lateral abdominal wall.  It was hard to  delineate peritoneum versus cecal wall.  At this point we felt the safest course of action was to finish the rest of the procedure through an open incision..  A mini midline incision was made incorporating the infraumbilical incision extending it superiorly.  Subcutaneous tissue was divided with electrocautery.  The fascia was incised and the abdominal cavity was entered.  We ended up enlarging the incision a little bit more inferior to aid with visualization of the right lower quadrant.  A wound protector was placed.  We had good mobilization of the transverse colon and hepatic flexure and were able delivered up out of the abdomen.  We then reidentified the section of distal small bowel about 40-45 cm up from the terminal ileum that had about a 2 inch section that was densely adhered to the cecum.  We decided that that would need to come out en bloc with the specimen (meaning that I would not try to dissect it off of the cecum) since that 2in section of ileum was densely adhered to the cecum.  The TI and cecum were then reinspected.  Again it was essentially plastered to the right lateral abdominal wall near the pelvic inlet.  We were not making any headway  with fine dissection with Metzenbaums.  There was essentially no plane.  It appeared that the cecum or appendix had perforated into the right lower quadrant lateral abdominal wall.  I was able to finger fracture the cecum and terminal ileum off but we did get into a cavity that drained some jellylike material.  Portion of the right tube and ovary was also tethered and we were able to separate that off with electrocautery and tied off the tip of the right tube and ovary with a 2-0 silk suture.  Once we had done this where we will deliver the cecum and terminal ileum up out of the abdomen.  Really could not visualize the appendix in all of this.  There was no evidence that the appendix was left behind within the abdominal cavity.  There was a small cuff of tissue  on the peritoneum at the right pelvic inlet which was left alone.  Again that was not consistent with the appendix.  Again there was a 2in of section of small bowel that was densely adhered to the medial wall of the cecum about 45 cm upstream from the terminal ileum.  We did not feel that we needed to lose all that small bowel in between the terminal ileum and the upstream segment that was adhered to the medial wall of the cecum.  So we did a small segmental small bowel resection of that 2 inch section of ileum that was adhered to the cecum.  It was divided proximal and distal to where it was adhesed to the medial wall of the cecum with a GIA 75 stapler blue load x 2.  The small bowel mesentery was taken down with LigaSure device.   We then identified a area of the terminal ileum and divided it with another fire of the GIA 75 stapler with a blue load after creating a window in the mesentery just next to the bowel lumen.  There appeared to be a palpable lymph node in the ileocolonic mesentery.  We then identified a segment of the transverse colon just proximal to the middle colic vessel and divided the transverse colon with a fire of the GIA 75 stapler with a blue load.  We then started taking down the colonic mesentery in a sequential fashion using her LigaSure device.  Once we got to the ileocolonic pedicle we clamped it with a Kelly and used LigaSure distally.  We then tied off the ileocolonic pedicle with a 2-0 silk tie.  This freed the specimen and it was passed off the field.  We then inspected the right lower quadrant and the pelvic inlet.  There was oozing from 1 location possibly from another area around the gonadal vessels.  A 2-0 silk suture was used to achieve hemostasis in this location.  We did not visualize the right ureter.  We then reconnected the area of mid ileum that had been resected to create a small bowel to small bowel anastomosis by performing a functional end-to-end side-to-side anastomosis  using a GIA 75 stapler with a blue load.  Enterotomies were made at the corner of each small bowel staple lines.   1 limb of a GIA 75 stapler with a blue load was placed reaching the enterotomies and the staple was brought together and fired to grade a common channel.  The common defect was closed with a TA 60 stapler with a blue load.  The mesenteric defect was then closed with several interrupted 2-0 sutures.  A 3-0 silk  suture was placed in the crotch of the anastomosis.  The anastomosis was widely patent and had excellent blood flow to it.  There was 1 area that was little bit oozy along the staple line which was dealt with with a 3-0 silk suture in a Lembert fashion.  The corners of the staple line were then tucked with 3-0 silk Lembert sutures.  We then went about reconnecting the distal ileum to the transverse colon to restore small bowel continuity to the colon.  the distal small bowel was aligned to the transverse colon in a side-to-side fashion ensuring that the small bowel mesentery was not twisted.  An enterotomy was made in the small bowel and a colotomy was made just proximal to the staple line in the transverse colon.  One fourth of the GIA 75 stapler was placed through the enterotomy the other through the colotomy and the staple was brought together and fired to grade a common channel.  The common defect was then closed with a TA 60 stapler with a blue load.  The mesenteric defect was left open since it was quite large.  We imbricated the corner of the staple line with 3-0 silk sutures.  I then placed a tongue of omentum over the ileocolonic anastomosis and tacked it down with a 2-0 silk suture.  We then irrigated the abdomen copiously.  There was no evidence of bleeding.  The bowel was returned to the abdomen.  The wound protector was removed.  We then instituted clean protocol.  New drapes were placed new instruments were brought onto the field for closing.  New gown and gloves were put on by all  people.  We then closed the fascia with a #1 looped PDS x 2 and tied centrally.  The subcutaneous tissue was then irrigated with saline and hemostasis achieved.  Skin was closed with staples.  The 3 trocar sites were closed with skin staples as well.  Honeycomb dressing was applied and bandages over the trocar sites.  All needle, instrument, and sponge counts were correct x 2.  There were no immediate complication.  The patient tolerated seizure well.  She was extubated and taken to the recovery room in stable condition updated the family at the end of the procedure.   Findings: The patient had a section of mid ileum about a 2 inch section that was adhered to the medial wall of the cecum.  This was about 45 cm proximal from the terminal ileum. This 2in section of the small bowel was stapled off proximal and distal to where it was attached to the cecum.  The small bowel was then reanastomosed.   The cecum and TI were plastered to the right pelvic inlet and right lower quadrant abdominal wall.  It appeared that it had perforated into his location and sealed.  The cavity was entered with some jellylike material.  This was unavoidable.  The terminal ileum and right colon were then resected and stapled off and the distal ileum was reanastomosed to the transverse colon for the partial colectomy portion of the procedure. The laparoscopic portion of the procedure included diagnostic laparoscopy as well as mobilizing the ascending colon, hepatic flexure and transverse colon laparoscopically.   PLAN OF CARE: Admit to inpatient   PATIENT DISPOSITION:  PACU - hemodynamically stable.   Delay start of Pharmacological VTE agent (>24hrs) due to surgical blood loss or risk of bleeding:  no  Camellia HERO. Tanda, MD, FACS General, Bariatric, & Minimally Invasive [facility]  [facility], [facility]

## 2024-08-02 NOTE — Anesthesia Procedure Notes (Signed)
 Procedure Name: Intubation Date/Time: 08/02/2024 11:39 AM  Performed by: Nanci Riis, CRNAPre-anesthesia Checklist: Emergency Drugs available, Patient identified, Suction available, Patient being monitored and Timeout performed Patient Re-evaluated:Patient Re-evaluated prior to induction Oxygen Delivery Method: Circle system utilized Preoxygenation: Pre-oxygenation with 100% oxygen Induction Type: IV induction Ventilation: Mask ventilation without difficulty Laryngoscope Size: Miller and 3 Grade View: Grade I Tube type: Oral Tube size: 7.0 mm Number of attempts: 1 Airway Equipment and Method: Stylet Placement Confirmation: ETT inserted through vocal cords under direct vision, positive ETCO2 and breath sounds checked- equal and bilateral Secured at: 21 cm Tube secured with: Tape Dental Injury: Teeth and Oropharynx as per pre-operative assessment

## 2024-08-02 NOTE — Interval H&P Note (Signed)
 History and Physical Interval Note:  08/02/2024 10:47 AM  Julie Greene  has presented today for surgery, with the diagnosis of cecal mass.  The various methods of treatment have been discussed with the patient and family. After consideration of risks, benefits and other options for treatment, the patient has consented to  Procedures: COLECTOMY, RIGHT, LAPAROSCOPIC (Right) as a surgical intervention.  The patient's history has been reviewed, patient examined, no change in status, stable for surgery.  I have reviewed the patient's chart and labs.  Questions were answered to the patient's satisfaction.    Pt pleated her preop bowel prep.  She received subcu heparin  and Entereg  and Tylenol  preoperatively We rediscussed the typical hospitalization and typical recovery with the patient Julie Greene

## 2024-08-02 NOTE — Transfer of Care (Signed)
 Immediate Anesthesia Transfer of Care Note  Patient: Julie Greene  Procedure(s) Performed: COLECTOMY, RIGHT, LAPAROSCOPIC, SMALL BOWEL RESECTION (Right)  Patient Location: PACU  Anesthesia Type:General  Level of Consciousness: drowsy and patient cooperative  Airway & Oxygen Therapy: Patient Spontanous Breathing  Post-op Assessment: Report given to RN and Post -op Vital signs reviewed and stable  Post vital signs: Reviewed and stable  Last Vitals:  Vitals Value Taken Time  BP    Temp    Pulse 84 08/02/24 14:36  Resp 12 08/02/24 14:36  SpO2 97 % 08/02/24 14:36  Vitals shown include unfiled device data.  Last Pain:  Vitals:   08/02/24 0924  TempSrc:   PainSc: 0-No pain         Complications: No notable events documented.

## 2024-08-02 NOTE — Anesthesia Postprocedure Evaluation (Signed)
"   Anesthesia Post Note  Patient: Julie Greene  Procedure(s) Performed: COLECTOMY, RIGHT, LAPAROSCOPIC, SMALL BOWEL RESECTION (Right)     Patient location during evaluation: PACU Anesthesia Type: General Level of consciousness: awake and alert Pain management: pain level controlled Vital Signs Assessment: post-procedure vital signs reviewed and stable Respiratory status: spontaneous breathing, nonlabored ventilation, respiratory function stable and patient connected to nasal cannula oxygen Cardiovascular status: blood pressure returned to baseline and stable Postop Assessment: no apparent nausea or vomiting Anesthetic complications: no   No notable events documented.  Last Vitals:  Vitals:   08/02/24 1530 08/02/24 1606  BP:  (!) 147/71  Pulse: 87 91  Resp: 10 14  Temp: (!) 36.1 C 36.4 C  SpO2: 98% 100%    Last Pain:  Vitals:   08/02/24 1606  TempSrc: Oral  PainSc:                  Lynwood MARLA Cornea      "

## 2024-08-03 ENCOUNTER — Encounter (HOSPITAL_COMMUNITY): Payer: Self-pay | Admitting: General Surgery

## 2024-08-03 LAB — BASIC METABOLIC PANEL WITH GFR
Anion gap: 8 (ref 5–15)
BUN: 9 mg/dL (ref 8–23)
CO2: 26 mmol/L (ref 22–32)
Calcium: 8.7 mg/dL — ABNORMAL LOW (ref 8.9–10.3)
Chloride: 102 mmol/L (ref 98–111)
Creatinine, Ser: 0.78 mg/dL (ref 0.44–1.00)
GFR, Estimated: >60 mL/min
Glucose, Bld: 170 mg/dL — ABNORMAL HIGH (ref 70–99)
Potassium: 3.6 mmol/L (ref 3.5–5.1)
Sodium: 136 mmol/L (ref 135–145)

## 2024-08-03 LAB — MAGNESIUM: Magnesium: 2.1 mg/dL (ref 1.7–2.4)

## 2024-08-03 LAB — CBC
HCT: 31.2 % — ABNORMAL LOW (ref 36.0–46.0)
Hemoglobin: 10.1 g/dL — ABNORMAL LOW (ref 12.0–15.0)
MCH: 27.2 pg (ref 26.0–34.0)
MCHC: 32.4 g/dL (ref 30.0–36.0)
MCV: 83.9 fL (ref 80.0–100.0)
Platelets: 325 K/uL (ref 150–400)
RBC: 3.72 MIL/uL — ABNORMAL LOW (ref 3.87–5.11)
RDW: 15.2 % (ref 11.5–15.5)
WBC: 16.4 K/uL — ABNORMAL HIGH (ref 4.0–10.5)
nRBC: 0 % (ref 0.0–0.2)

## 2024-08-03 MED ORDER — AMLODIPINE BESYLATE 10 MG PO TABS
10.0000 mg | ORAL_TABLET | Freq: Every morning | ORAL | Status: DC
  Administered 2024-08-03 – 2024-08-06 (×4): 10 mg via ORAL
  Filled 2024-08-03 (×4): qty 1

## 2024-08-03 NOTE — Progress Notes (Signed)
 1 Day Post-Op   Subjective/Chief Complaint: Pain ok  1 episode of small emesis while walking last night No flatus   Objective: Vital signs in last 24 hours: Temp:  [97 F (36.1 C)-98.4 F (36.9 C)] 97.7 F (36.5 C) (01/06 0800) Pulse Rate:  [78-102] 88 (01/06 0800) Resp:  [10-18] 18 (01/06 0800) BP: (130-160)/(68-102) 147/68 (01/06 0800) SpO2:  [92 %-100 %] 97 % (01/06 0800) Weight:  [75.8 kg-80.2 kg] 80.2 kg (01/06 0500) Last BM Date : 08/02/24  Intake/Output from previous day: 01/05 0701 - 01/06 0700 In: 1798.4 [P.O.:690; I.V.:1108.4] Out: 1600 [Urine:1500; Blood:100] Intake/Output this shift: No intake/output data recorded.  Alert, nontoxic Symm chest rise, nonlabored Reg Soft, obese, dressing c/d/I; approp TTP No edema  Lab Results:  Recent Labs    08/03/24 0457  WBC 16.4*  HGB 10.1*  HCT 31.2*  PLT 325   BMET Recent Labs    08/03/24 0457  NA 136  K 3.6  CL 102  CO2 26  GLUCOSE 170*  BUN 9  CREATININE 0.78  CALCIUM 8.7*   PT/INR No results for input(s): LABPROT, INR in the last 72 hours. ABG No results for input(s): PHART, HCO3 in the last 72 hours.  Invalid input(s): PCO2, PO2  Studies/Results: No results found.  Anti-infectives: Anti-infectives (From admission, onward)    Start     Dose/Rate Route Frequency Ordered Stop   08/02/24 1000  cefoTEtan  (CEFOTAN ) 2 g in sodium chloride  0.9 % 100 mL IVPB        2 g 200 mL/hr over 30 Minutes Intravenous On call to O.R. 08/02/24 0859 08/02/24 1144       Assessment/Plan: s/p Procedures: COLECTOMY, RIGHT, LAPAROSCOPIC, SMALL BOWEL RESECTION (Right) POD1 s/p lap assisted right colectomy, small bowel resection for perforated RLQ mass  Clears, monitor for ileus Entereg  Doesn't need abx IMO - contained cavity of jelly like material -hopefully not mucin Vte prophylaxis -scds, lovenox  Monitor hgb HTN - cont home med  Discussed intra-op findings  LOS: 1 day    Camellia Blush 08/03/2024

## 2024-08-03 NOTE — Plan of Care (Signed)
   Problem: Education: Goal: Understanding of discharge needs will improve Outcome: Progressing

## 2024-08-04 LAB — CBC
HCT: 29.9 % — ABNORMAL LOW (ref 36.0–46.0)
Hemoglobin: 9.7 g/dL — ABNORMAL LOW (ref 12.0–15.0)
MCH: 27.4 pg (ref 26.0–34.0)
MCHC: 32.4 g/dL (ref 30.0–36.0)
MCV: 84.5 fL (ref 80.0–100.0)
Platelets: 322 K/uL (ref 150–400)
RBC: 3.54 MIL/uL — ABNORMAL LOW (ref 3.87–5.11)
RDW: 15.5 % (ref 11.5–15.5)
WBC: 15.3 K/uL — ABNORMAL HIGH (ref 4.0–10.5)
nRBC: 0.1 % (ref 0.0–0.2)

## 2024-08-04 LAB — BASIC METABOLIC PANEL WITH GFR
Anion gap: 10 (ref 5–15)
BUN: 7 mg/dL — ABNORMAL LOW (ref 8–23)
CO2: 27 mmol/L (ref 22–32)
Calcium: 8.5 mg/dL — ABNORMAL LOW (ref 8.9–10.3)
Chloride: 104 mmol/L (ref 98–111)
Creatinine, Ser: 0.73 mg/dL (ref 0.44–1.00)
GFR, Estimated: >60 mL/min
Glucose, Bld: 110 mg/dL — ABNORMAL HIGH (ref 70–99)
Potassium: 3.3 mmol/L — ABNORMAL LOW (ref 3.5–5.1)
Sodium: 141 mmol/L (ref 135–145)

## 2024-08-04 MED ORDER — POTASSIUM CHLORIDE CRYS ER 20 MEQ PO TBCR
40.0000 meq | EXTENDED_RELEASE_TABLET | Freq: Once | ORAL | Status: AC
  Administered 2024-08-04: 40 meq via ORAL
  Filled 2024-08-04: qty 2

## 2024-08-04 NOTE — Progress Notes (Signed)
 2 Days Post-Op   Subjective/Chief Complaint: Had some abd pain overnight in lower abd No flatus No burping Tolerated clears States she walked several times in halls  Objective: Vital signs in last 24 hours: Temp:  [98.2 F (36.8 C)-98.9 F (37.2 C)] 98.9 F (37.2 C) (01/07 0522) Pulse Rate:  [91-98] 91 (01/07 0522) Resp:  [18] 18 (01/07 0522) BP: (139-149)/(64-73) 144/65 (01/07 0522) SpO2:  [93 %-98 %] 93 % (01/07 0522) Last BM Date : 08/02/24  Intake/Output from previous day: 01/06 0701 - 01/07 0700 In: 1286.9 [P.O.:480; I.V.:806.9] Out: 4350 [Urine:4350] Intake/Output this shift: No intake/output data recorded.  Alert, nontoxic Symm chest rise, nonlabored Reg Soft, obese, dressing c/d/I; approp TTP No edema  Lab Results:  Recent Labs    08/03/24 0457 08/04/24 0503  WBC 16.4* 15.3*  HGB 10.1* 9.7*  HCT 31.2* 29.9*  PLT 325 322   BMET Recent Labs    08/03/24 0457 08/04/24 0503  NA 136 141  K 3.6 3.3*  CL 102 104  CO2 26 27  GLUCOSE 170* 110*  BUN 9 7*  CREATININE 0.78 0.73  CALCIUM 8.7* 8.5*   PT/INR No results for input(s): LABPROT, INR in the last 72 hours. ABG No results for input(s): PHART, HCO3 in the last 72 hours.  Invalid input(s): PCO2, PO2  Studies/Results: No results found.  Anti-infectives: Anti-infectives (From admission, onward)    Start     Dose/Rate Route Frequency Ordered Stop   08/02/24 1000  cefoTEtan  (CEFOTAN ) 2 g in sodium chloride  0.9 % 100 mL IVPB        2 g 200 mL/hr over 30 Minutes Intravenous On call to O.R. 08/02/24 0859 08/02/24 1144       Assessment/Plan: s/p Procedures: COLECTOMY, RIGHT, LAPAROSCOPIC, SMALL BOWEL RESECTION (Right) POD2 s/p lap assisted right colectomy &  small bowel resection for perforated RLQ mass  FLD monitor for ileus, add protein shakes Entereg  Doesn't need abx IMO - contained cavity of jelly like material -hopefully not mucin Vte prophylaxis -scds,  lovenox  Hypokalemia - replace potassium Monitor hgb HTN - cont home med  Answered her questions   LOS: 2 days    Julie Greene 08/04/2024

## 2024-08-04 NOTE — Progress Notes (Signed)
" °   08/04/24 1057  TOC Brief Assessment  Insurance and Status Reviewed  Patient has primary care physician Yes  Home environment has been reviewed home alone  Prior level of function: independent  Prior/Current Home Services No current home services  Social Drivers of Health Review SDOH reviewed no interventions necessary  Readmission risk has been reviewed Yes  Transition of care needs no transition of care needs at this time    "

## 2024-08-04 NOTE — Plan of Care (Signed)
  Problem: Activity: Goal: Ability to tolerate increased activity will improve Outcome: Progressing   Problem: Bowel/Gastric: Goal: Gastrointestinal status for postoperative course will improve Outcome: Progressing

## 2024-08-05 LAB — CBC
HCT: 27 % — ABNORMAL LOW (ref 36.0–46.0)
Hemoglobin: 8.6 g/dL — ABNORMAL LOW (ref 12.0–15.0)
MCH: 27.4 pg (ref 26.0–34.0)
MCHC: 31.9 g/dL (ref 30.0–36.0)
MCV: 86 fL (ref 80.0–100.0)
Platelets: 274 K/uL (ref 150–400)
RBC: 3.14 MIL/uL — ABNORMAL LOW (ref 3.87–5.11)
RDW: 15.9 % — ABNORMAL HIGH (ref 11.5–15.5)
WBC: 11.2 K/uL — ABNORMAL HIGH (ref 4.0–10.5)
nRBC: 0 % (ref 0.0–0.2)

## 2024-08-05 LAB — BASIC METABOLIC PANEL WITH GFR
Anion gap: 7 (ref 5–15)
BUN: 10 mg/dL (ref 8–23)
CO2: 29 mmol/L (ref 22–32)
Calcium: 8.6 mg/dL — ABNORMAL LOW (ref 8.9–10.3)
Chloride: 104 mmol/L (ref 98–111)
Creatinine, Ser: 0.66 mg/dL (ref 0.44–1.00)
GFR, Estimated: >60 mL/min
Glucose, Bld: 107 mg/dL — ABNORMAL HIGH (ref 70–99)
Potassium: 3.5 mmol/L (ref 3.5–5.1)
Sodium: 140 mmol/L (ref 135–145)

## 2024-08-05 LAB — MAGNESIUM: Magnesium: 2.5 mg/dL — ABNORMAL HIGH (ref 1.7–2.4)

## 2024-08-05 NOTE — Progress Notes (Signed)
 3 Days Post-Op   Subjective/Chief Complaint: Reports 6 bms  No n/v Walked Pain ok Liked the cream of chicken  Objective: Vital signs in last 24 hours: Temp:  [98.2 F (36.8 C)-98.7 F (37.1 C)] 98.2 F (36.8 C) (01/08 0449) Pulse Rate:  [83-107] 83 (01/08 0449) Resp:  [18] 18 (01/08 0449) BP: (132-137)/(70-75) 137/75 (01/08 0449) SpO2:  [94 %-96 %] 96 % (01/08 0449) Last BM Date : 08/04/24  Intake/Output from previous day: 01/07 0701 - 01/08 0700 In: 1920 [P.O.:1920] Out: 1300 [Urine:1300] Intake/Output this shift: No intake/output data recorded.  Alert, nontoxic Symm chest rise, nonlabored Reg Soft, obese, dressing c/d/I; approp TTP No edema  Lab Results:  Recent Labs    08/04/24 0503 08/05/24 0525  WBC 15.3* 11.2*  HGB 9.7* 8.6*  HCT 29.9* 27.0*  PLT 322 274   BMET Recent Labs    08/04/24 0503 08/05/24 0525  NA 141 140  K 3.3* 3.5  CL 104 104  CO2 27 29  GLUCOSE 110* 107*  BUN 7* 10  CREATININE 0.73 0.66  CALCIUM 8.5* 8.6*   PT/INR No results for input(s): LABPROT, INR in the last 72 hours. ABG No results for input(s): PHART, HCO3 in the last 72 hours.  Invalid input(s): PCO2, PO2  Studies/Results: No results found.  Anti-infectives: Anti-infectives (From admission, onward)    Start     Dose/Rate Route Frequency Ordered Stop   08/02/24 1000  cefoTEtan  (CEFOTAN ) 2 g in sodium chloride  0.9 % 100 mL IVPB        2 g 200 mL/hr over 30 Minutes Intravenous On call to O.R. 08/02/24 0859 08/02/24 1144       Assessment/Plan: s/p Procedures: COLECTOMY, RIGHT, LAPAROSCOPIC, SMALL BOWEL RESECTION (Right) POD3  s/p lap assisted right colectomy &  small bowel resection for perforated RLQ mass  Adv to soft diet Entereg  Doesn't need abx IMO - contained cavity of jelly like material -hopefully not mucin Vte prophylaxis -scds, lovenox  Hypokalemia - resolved Acute on chronic anemia - hgb drifting down slowly; repeat in am HTN - cont  home med  Answered her questions   If hgb stabilizes and tolerates soft diet - prob dc on 1/9   LOS: 3 days    Julie Greene 08/05/2024

## 2024-08-05 NOTE — Discharge Instructions (Addendum)
 " POST OP INSTRUCTIONS AFTER COLON SURGERY  DIET: Be sure to include lots of fluids daily to stay hydrated - 64oz of water  per day (8, 8 oz glasses).  Avoid fast food or heavy meals for the first couple of weeks as your are more likely to get nauseated. Avoid raw/uncooked fruits or vegetables for the first 4 weeks (its ok to have these if they are blended into smoothie form). If you have fruits/vegetables, make sure they are cooked until soft enough to mash on the roof of your mouth and chew your food well. Otherwise, diet as tolerated.  Take your usually prescribed home medications unless otherwise directed.  PAIN CONTROL: Pain is best controlled by a usual combination of three different methods TOGETHER: Ice/Heat Over the counter pain medication Prescription pain medication Most patients will experience some swelling and bruising around the surgical site.  Ice packs or heating pads (30-60 minutes up to 6 times a day) will help. Some people prefer to use ice alone, heat alone, alternating between ice & heat.  Experiment to what works for you.  Swelling and bruising can take several weeks to resolve.   It is helpful to take an over-the-counter pain medication regularly for the first few weeks: Ibuprofen (Motrin/Advil) - 200mg  tabs - take 3 tabs (600mg ) every 6 hours as needed for pain (unless you have been directed previously to avoid NSAIDs/ibuprofen). Do not take on a empty stomach.  Acetaminophen  (Tylenol ) - you may take 650mg  every 6 hours as needed. You can take this with motrin as they act differently on the body. If you are taking a narcotic pain medication that has acetaminophen  in it, do not take over the counter tylenol  at the same time. NOTE: You may take both of these medications together - most patients  find it most helpful when alternating between the two (i.e. Ibuprofen at 6am, tylenol  at 9am, ibuprofen at 12pm ..SABRA) A  prescription for pain medication will be sent electronically to a  pharmacy upon discharge.  Take your pain medication as prescribed if your pain is not adequatly controlled with the over-the-counter pain reliefs mentioned above.  Avoid getting constipated.  Between the surgery and the pain medications, it is common to experience some constipation.  Increasing fluid intake and taking a fiber supplement (such as Metamucil, Citrucel, FiberCon, MiraLax, etc) 1-2 times a day regularly will usually help prevent this problem from occurring.  A mild laxative (prune juice, Milk of Magnesia, MiraLax, etc) should be taken according to package directions if there are no bowel movements after 48 hours.    Dressing/Wound care:  Unless discharge instructions indicate otherwise, follow guidelines below  If you have STERI-STRIPS - you may remove your outer bandages 48 hours after surgery, and you may shower at that time.  You have steri-strips (small skin tapes) in place directly over the incision.  These strips should be left on the skin for 7-10 days.   If you have DERMABOND/SKIN GLUE - you may shower in 24 hours.  The glue will flake off over the next 2-3 weeks. If you have SKIN STAPLES - Any sutures or staples will be removed at the office during your follow-up visit.  Sometimes you may given a separate appointment with a [facility] nurse to remove the skin staples about 10-14 days after your surgery.  May cover incisions/staples with dry gauze and tape. May shower. Incisions can get wet in shower.   Avoid baths/pools/lakes/oceans until your wounds have fully healed.  ACTIVITIES  as tolerated:   Avoid heavy lifting (>10lbs or 1 gallon of milk) for the next 6 weeks. You may resume regular daily activities as tolerated--such as daily self-care, walking, climbing stairs--gradually increasing activities as tolerated.  If you can walk 30 minutes without difficulty, it is safe to try more intense activity such as jogging, treadmill, bicycling, low-impact aerobics.  DO NOT PUSH THROUGH PAIN.   Let pain be your guide: If it hurts to do something, don't do it. You may drive when you are no longer taking prescription pain medication, you can comfortably wear a seatbelt, and you can safely maneuver your car and apply brakes.  FOLLOW UP in our office Please call [facility] at [phone] to set up an appointment to see your surgeon in the office for a follow-up appointment approximately 2-3 weeks after your surgery. Make sure that you call for this appointment during a weekday if your follow-up appointment was listed on your discharge papers from the hospital If you have staples, you may be given a separate appointment with a [facility] nurse office to remove the staples 10-14 days after your surgery.    8. If you have disability or family leave forms that need to be completed related to your surgery, you may drop them off at our office; for return to work instructions, please ask our office staff and they will be happy to assist you in obtaining this documentation   When to call us  [phone]: Poor pain control Reactions / problems with new medications (rash/itching, etc)  Fever over 101.5 F (38.5 C) Inability to urinate Nausea/vomiting Worsening swelling or bruising Continued bleeding from incision. Increased pain, redness, or drainage from the incision  The clinic staff is available to answer your questions during regular business hours (8:30am-5pm).  Please dont hesitate to call and ask to speak to one of our nurses for clinical concerns.   A surgeon from [facility] is always on call at the hospitals   If you have a medical emergency, go to the nearest emergency room or call 911.  [facility],  A [facility] [address], [place], [place]  [address] MAIN: [phone] FAX: [phone]  "

## 2024-08-05 NOTE — Plan of Care (Signed)
  Problem: Activity: Goal: Risk for activity intolerance will decrease Outcome: Progressing   Problem: Coping: Goal: Level of anxiety will decrease Outcome: Progressing   Problem: Elimination: Goal: Will not experience complications related to bowel motility Outcome: Progressing

## 2024-08-05 NOTE — Progress Notes (Signed)
 Pharmacy Brief Note - Alvimopan  (Entereg )  The standing order set for alvimopan  (Entereg ) now includes an automatic order to discontinue the drug after the patient has had a bowel movement. The change was approved by the Pharmacy & Therapeutics Committee and the Medical Executive Committee.   This patient has had bowel movements documented by nursing. Therefore, alvimopan  has been discontinued. If there are questions, please contact the pharmacy at [phone].    Thank you for allowing pharmacy to be a part of this patients care.  Marget Hench, PharmD Clinical Pharmacist 08/05/2024 10:49 AM

## 2024-08-05 NOTE — Plan of Care (Signed)
  Problem: Education: Goal: Understanding of discharge needs will improve Outcome: Progressing Goal: Verbalization of understanding of the causes of altered bowel function will improve Outcome: Progressing   Problem: Activity: Goal: Ability to tolerate increased activity will improve Outcome: Progressing

## 2024-08-06 ENCOUNTER — Other Ambulatory Visit: Payer: Self-pay | Admitting: *Deleted

## 2024-08-06 ENCOUNTER — Other Ambulatory Visit (HOSPITAL_COMMUNITY): Payer: Self-pay

## 2024-08-06 ENCOUNTER — Encounter: Payer: Self-pay | Admitting: *Deleted

## 2024-08-06 DIAGNOSIS — C181 Malignant neoplasm of appendix: Secondary | ICD-10-CM

## 2024-08-06 LAB — CBC
HCT: 26.1 % — ABNORMAL LOW (ref 36.0–46.0)
Hemoglobin: 8.4 g/dL — ABNORMAL LOW (ref 12.0–15.0)
MCH: 27.3 pg (ref 26.0–34.0)
MCHC: 32.2 g/dL (ref 30.0–36.0)
MCV: 84.7 fL (ref 80.0–100.0)
Platelets: 278 K/uL (ref 150–400)
RBC: 3.08 MIL/uL — ABNORMAL LOW (ref 3.87–5.11)
RDW: 15.9 % — ABNORMAL HIGH (ref 11.5–15.5)
WBC: 9.5 K/uL (ref 4.0–10.5)
nRBC: 0 % (ref 0.0–0.2)

## 2024-08-06 MED ORDER — OXYCODONE HCL 5 MG PO TABS
5.0000 mg | ORAL_TABLET | Freq: Four times a day (QID) | ORAL | 0 refills | Status: AC | PRN
  Filled 2024-08-06: qty 15, 4d supply, fill #0

## 2024-08-06 MED ORDER — ACETAMINOPHEN 500 MG PO TABS: 1000.0000 mg | ORAL_TABLET | Freq: Four times a day (QID) | ORAL | Status: AC

## 2024-08-06 NOTE — Discharge Summary (Signed)
 " Physician Discharge Summary  Julie Greene FMW:[id number] DOB: 23-Mar-1956 DOA: 08/02/2024  PCP: Rik Glinda DASEN, FNP  Admit date: 08/02/2024 Discharge date: 08/06/2024  Recommendations for Outpatient Follow-up:     Follow-up Information     [facility], [facility]. Go on 08/16/2024.   Specialty: General Surgery Why: for staple removal at 10 am Contact information: [address] [place] [address] [phone]         Tanda Locus, MD Follow up on 08/26/2024.   Specialty: General Surgery Why: arrive at 9:15 AM for postop check Contact information: [address] [place] [address] [phone]                Discharge Diagnoses:  Metastatic  mucinous adenocarcinoma of appendix T4bN2, +radial margin HTN Acute on chronic anemia  Surgical Procedure: 08/02/2024 Dr Tanda 1) Laparoscopic assisted right colectomy converted to open 2) SMALL BOWEL RESECTION  Discharge Condition: good Disposition: home  Diet recommendation: soft/low fiber  Filed Weights   08/02/24 1606 08/03/24 0500 08/06/24 0722  Weight: 78.7 kg 80.2 kg 80.2 kg    History of present illness:  Patient is a 69 year old female who had presented back in mid November with several days of abdominal pain. Her initial CT showed a complex thick-walled fluid collection in the right ileocolonic mesentery measuring 6 x 6.5 cm. The appendix was not seen separately. There was surrounding inflammatory changes and there was no extraluminal air. Radiology felt it was most consistent with appendicitis with abscess. Given the location of the abscess it was not amendable to percutaneous drainage. She was medically managed with IV antibiotics. Her pain improved her white count normalized and she was discharged home. We discussed management of ruptured appendicitis in a senior citizen. We discussed the risk of recurrent appendicitis, the risk of underlying malignancy. We discussed pros and cons  of interval appendectomy versus colonoscopic evaluation and long-term nonoperative management. We arranged a follow-up CT scan since the patient was interested in a interval appendectomy. Her CT scan was done about a month after her initial presentation. Unfortunately the right lower quadrant essentially appeared unchanged. There was still a thick walled complex cystic structure in the right lower quadrant involving the cecum. The appendix was visualized but then appendiceal diameter of 12 mm and distended. Because essentially there was no change in the CT scan this raise the concern for an underlying malignancy. Recommended that the patient proceed with partial colectomy at this point   Hospital Course:  Patient received a preoperative bowel prep.  She received subcutaneous heparin  preoperatively.  She also received Entereg  perioperatively for bowel recovery.  She was taken to the operating room on January 5 for the above-mentioned procedure.  The cecum and appendix were essentially plastered to her right lower quadrant abdominal wall there was no plane for dissection and it had to be finger fractured off revealing some jellylike mucin material unfortunately.  She underwent a right colectomy.  She also underwent a separate segmental small bowel resection because there was a small section of mid ileum about a 2 inch long segment that was densely adhered to the medial wall of the cecum.  Postoperatively she was maintained on chemical VTE prophylaxis.  She was started on clears on postop day 0.  Her diet was gradually advanced as her bowel function returned.  She was advanced to a soft diet on January 8.  She had been having bowel movements for several days now.  She had  been ambulating in the hall.  Her vital signs were stable.  She had a little bit of a downward drift in her hemoglobin after surgery but it was stable on the last 2 days of admission.  On day of discharge she was tolerating a diet without vomiting,  her pain was controlled.  She was having bowel movements.  Her vital signs were stable.  Her hemoglobin was stable.  I reviewed discharge instructions personally with her.  She was also given a copy of a low fiber diet.  We also discussed her pathology report.  We discussed that this would involve a referral to the cancer center to talk about pros and cons of chemotherapy once she has recovered from her abdominal surgery.  Chaplain was brought to the bedside for spiritual counseling.  BP 123/60 (BP Location: Right Arm)   Pulse 82   Temp 98.4 F (36.9 C) (Oral)   Resp 18   Ht 5' 4 (1.626 m)   Wt 80.2 kg   SpO2 97%   BMI 30.35 kg/m   Gen: alert, NAD, non-toxic appearing Pupils: equal, no scleral icterus Pulm: symmetric chest rise CV: regular rate and rhythm Abd: soft, apprrop tender, nondistended.  No cellulitis. No incisional hernia Ext: no edema, no calf tenderness Skin: no rash, no jaundice   Discharge Instructions  Discharge Instructions     Call MD for:   Complete by: As directed    Temperature >101   Call MD for:  hives   Complete by: As directed    Call MD for:  persistant dizziness or light-headedness   Complete by: As directed    Call MD for:  persistant nausea and vomiting   Complete by: As directed    Call MD for:  redness, tenderness, or signs of infection (pain, swelling, redness, odor or green/yellow discharge around incision site)   Complete by: As directed    Call MD for:  severe uncontrolled pain   Complete by: As directed    Diet general   Complete by: As directed    Soft/low fiber diet for 2 weeks   Discharge instructions   Complete by: As directed    See [facility] discharge instructions   Discharge wound care:   Complete by: As directed    You may need to cover incision with dry gauze to prevent skin staples from rubbing on clothing; see [facility] discharge instructions   Increase activity slowly   Complete by: As directed       Allergies as of 08/06/2024        Reactions   Sulfonamide Derivatives Hives        Medication List     PAUSE taking these medications    aspirin  81 MG tablet Wait to take this until: August 11, 2024 Take 81 mg by mouth daily.       STOP taking these medications    lisinopril -hydrochlorothiazide  20-12.5 MG tablet Commonly known as: ZESTORETIC        TAKE these medications    acetaminophen  500 MG tablet Commonly known as: TYLENOL  Take 2 tablets (1,000 mg total) by mouth every 6 (six) hours for 5 days. What changed:  when to take this reasons to take this   amLODipine  10 MG tablet Commonly known as: NORVASC  Take 10 mg by mouth every morning. What changed: Another medication with the same name was removed. Continue taking this medication, and follow the directions you see here.   Echinacea 400 MG Caps Take 400 mg by  mouth daily.   oxyCODONE  5 MG immediate release tablet Commonly known as: Oxy IR/ROXICODONE  Take 1 tablet (5 mg total) by mouth every 6 (six) hours as needed for severe pain (pain score 7-10).   pravastatin  20 MG tablet Commonly known as: PRAVACHOL  Take 1 tablet (20 mg total) by mouth daily.   Vitamin D  50 MCG (2000 UT) Caps Take 2,000 Units by mouth daily.               Discharge Care Instructions  (From admission, onward)           Start     Ordered   08/06/24 0000  Discharge wound care:       Comments: You may need to cover incision with dry gauze to prevent skin staples from rubbing on clothing; see [facility] discharge instructions   08/06/24 1046            Follow-up Information     [facility], [facility]. Go on 08/16/2024.   Specialty: General Surgery Why: for staple removal at 10 am Contact information: [address] [place] [address] [phone]         Tanda Locus, MD Follow up on 08/26/2024.   Specialty: General Surgery Why: arrive at 9:15 AM for postop check Contact information: [address]  [place] [address] [phone]                  The results of significant diagnostics from this hospitalization (including imaging, microbiology, ancillary and laboratory) are listed below for reference.    Significant Diagnostic Studies: CT ABDOMEN PELVIS W CONTRAST Result Date: 07/16/2024 EXAM: CT ABDOMEN AND PELVIS WITH CONTRAST 07/16/2024 04:13:14 PM TECHNIQUE: CT of the abdomen and pelvis was performed with the administration of 100 mL of iopamidol  (ISOVUE -300) 61% injection. Multiplanar reformatted images are provided for review. Automated exposure control, iterative reconstruction, and/or weight-based adjustment of the mA/kV was utilized to reduce the radiation dose to as low as reasonably achievable. COMPARISON: 06/14/2024 CLINICAL HISTORY: FINDINGS: LOWER CHEST: Patchy infiltrates demonstrated throughout the lung bases likely to represent multifocal pneumonia or possibly aspiration. LIVER: Mild diffuse fatty infiltration of the liver. GALLBLADDER AND BILE DUCTS: Gallbladder is unremarkable. No biliary ductal dilatation. SPLEEN: No acute abnormality. PANCREAS: No acute abnormality. ADRENAL GLANDS: No acute abnormality. KIDNEYS, URETERS AND BLADDER: Renal nephrograms are symmetrical. Bilateral renal simple cysts measuring up to 2.2 cm diameter. No change. No imaging follow-up is indicated. Per consensus, no follow-up is needed for simple Bosniak type 1 and 2 renal cysts, unless the patient has a malignancy history or risk factors. No stones in the kidneys or ureters. No hydronephrosis or hydroureter. No perinephric or periureteral stranding. The bladder is decompressed with a normal appearance. GI AND BOWEL: The stomach is filled with ingested material. No wall thickening. Small bowel is normal without distention or wall thickening. Contrast material flows through to the distal small bowel without obstruction. Lobulated thick-walled complex cystic structure in the right lower quadrant  involving the cecum with enhancing wall thickening and septation. This is unchanged since prior study. 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 Appendiceal abscess would also be a possibility but persistence of the lesion is worrisome for a mass lesion. Mild stranding in the adjacent fat. Fluid distention of the appendix with appendiceal diameter measuring about 12 mm. PERITONEUM AND RETROPERITONEUM: No ascites. No free air. VASCULATURE: Calcification of the aorta without aneurysm. LYMPH NODES: Prominent right lower quadrant  lymph nodes measuring up to 1.9 cm diameter are likely metastatic. REPRODUCTIVE ORGANS: Uterus and ovaries are not enlarged. BONES AND SOFT TISSUES: Degenerative changes in the spine. No focal bone lesions. No acute osseous abnormality. No focal soft tissue abnormality. Note: report results will be telephoned to the referring physician by the radiologist assistant and documented in the pacs dashboard. IMPRESSION: 1. Lobulated thick-walled complex cystic structure in the right lower quadrant involving the cecum with enhancing wall thickening and septation, unchanged since prior study. Differential includes cecal mesenteric carcinoma, appendiceal mucocele, or appendiceal abscess. Persistence of the lesion remains worrisome for a mass lesion. Mild stranding in the adjacent fat. Fluid distention of the appendix with appendiceal diameter measuring about 12 mm. 2. Prominent right lower quadrant lymph nodes measuring up to 1.9 cm in diameter, likely metastatic. 3. Patchy infiltrates throughout the lung bases, likely representing multifocal pneumonia or possibly aspiration. Electronically signed by: Elsie Gravely MD 07/16/2024 11:29 PM EST RP Workstation: [id number]    Microbiology: No results found for this or any previous visit (from the past 240 hours).   Labs: Basic Metabolic Panel: Recent Labs  Lab 08/03/24 0457 08/04/24 0503 08/05/24 0525  NA 136  141 140  K 3.6 3.3* 3.5  CL 102 104 104  CO2 26 27 29   GLUCOSE 170* 110* 107*  BUN 9 7* 10  CREATININE 0.78 0.73 0.66  CALCIUM 8.7* 8.5* 8.6*  MG 2.1  --  2.5*    CBC: Recent Labs  Lab 08/03/24 0457 08/04/24 0503 08/05/24 0525 08/06/24 0535  WBC 16.4* 15.3* 11.2* 9.5  HGB 10.1* 9.7* 8.6* 8.4*  HCT 31.2* 29.9* 27.0* 26.1*  MCV 83.9 84.5 86.0 84.7  PLT 325 322 274 278    CBG: No results for input(s): GLUCAP in the last 168 hours.  Principal Problem:   S/P partial colectomy   Time coordinating discharge: 55 min  Signed:  Camellia CHRISTELLA Blush, MD [facility], A [facility] [phone] 08/06/2024, 10:51 AM  "

## 2024-08-06 NOTE — Plan of Care (Signed)
  Problem: Activity: Goal: Ability to tolerate increased activity will improve Outcome: Progressing   Problem: Bowel/Gastric: Goal: Gastrointestinal status for postoperative course will improve Outcome: Progressing   Problem: Skin Integrity: Goal: Will show signs of wound healing Outcome: Progressing

## 2024-08-06 NOTE — Progress Notes (Signed)
 Discharge meds in a secure bag delivered to patient by this RN

## 2024-08-06 NOTE — Progress Notes (Signed)
" °   08/06/24 1225  Spiritual Encounters  Type of Visit Initial  Care provided to: Patient  Referral source Nurse (RN/NT/LPN)  Reason for visit Routine spiritual support  OnCall Visit No  Interventions  Spiritual Care Interventions Made Established relationship of care and support;Explored values/beliefs/practices/strengths;Prayer;Meaning making   I provided spiritual care support for Ms Apolinar Chancy upon referral by Maurilio, RN following new cancer diagnosis this morning.  Ms Redler welcomed my visit. She debreifed with me how the surgery she came to receive had revealed a mass and that her provider had confirmed it was cancer. She named several sisters who can be sources of support and others who have reached out. She also named her Sherlean faith as a source of strength and meaning.  I provided active llistening and compassionate presence. I explored her faith and belief, and then invited some ways of meaning making around this change. I explored her sources of support. I utilized her faith outlook to offer encouragement and also offered prayer with Ms [facility] consent.  Meaghen Vecchiarelli L. Delores HERO.Div "

## 2024-08-06 NOTE — Progress Notes (Unsigned)
 Oncology Discharge Planning Note  [facility] Address: [address], [place], [place] [address] Hours of Operation:  [place], Monday - Friday  Clinic Contact Information:  [phone]) [phone]  Oncology Care Team: Medical Oncologist:  Cloretta  Patient Details: Name:  Julie Greene, Julie Greene MRN:   [id number] DOB:   1956/07/16 Reason for Current Admission: @PPROB @  Discharge Planning Narrative: Notification of admission received by inpatient team for [facility].  Discharge follow-up appointments for oncology are current and available on the AVS and MyChart.   Upon discharge from the hospital, hematology/oncology's post discharge plan of care for the outpatient setting is:  January 26,2026 at 2:15 pm with Olam Ned NP  [facility] [address] [place], [place] [address]    Ernestene Coover will be called within two business days after discharge to review hematology/oncology's plan of care for full understanding.    Outpatient Oncology Specific Care Only: Oncology appointment transportation needs addressed?:  {YES/NO/NOT APPLICABLE:20182} Oncology medication management for symptom management addressed?:  {YES/NO/NOT APPLICABLE:20182} Chemo Alert Card reviewed?:  {YES/NO/NOT APPLICABLE:20182} Immunotherapy Alert Card reviewed?:  {YES/NO/NOT APPLICABLE:20182}

## 2024-08-06 NOTE — Care Management Important Message (Signed)
 Important Message  Patient Details IM Letter given. Name: Julie Greene MRN: [id number] Date of Birth: 11/24/1955   Important Message Given:  Yes - Medicare IM     Melba Ates 08/06/2024, 11:17 AM

## 2024-08-06 NOTE — Progress Notes (Signed)
 AVS reviewed with patient who verbalized an understanding. No other questions at this time. Ride will be here at 1400

## 2024-08-20 ENCOUNTER — Telehealth: Payer: Self-pay

## 2024-08-20 NOTE — Telephone Encounter (Signed)
 Spoke with patient directly and advised her that due to the inclement weather on Monday, her appointment has been rescheduled to Tuesday 08/24/2024 afternoon at 2:15 pm. She was agreeable and verbalized understanding.

## 2024-08-23 ENCOUNTER — Inpatient Hospital Stay: Admitting: Nurse Practitioner

## 2024-08-23 NOTE — Telephone Encounter (Signed)
 error

## 2024-08-24 ENCOUNTER — Encounter: Payer: Self-pay | Admitting: Nurse Practitioner

## 2024-08-24 ENCOUNTER — Encounter: Payer: Self-pay | Admitting: *Deleted

## 2024-08-24 ENCOUNTER — Other Ambulatory Visit: Payer: Self-pay | Admitting: *Deleted

## 2024-08-24 ENCOUNTER — Inpatient Hospital Stay

## 2024-08-24 ENCOUNTER — Inpatient Hospital Stay: Attending: Nurse Practitioner | Admitting: Nurse Practitioner

## 2024-08-24 VITALS — BP 126/68 | HR 100 | Temp 97.8°F | Resp 18 | Ht 64.0 in | Wt 165.5 lb

## 2024-08-24 DIAGNOSIS — E7800 Pure hypercholesterolemia, unspecified: Secondary | ICD-10-CM

## 2024-08-24 DIAGNOSIS — I10 Essential (primary) hypertension: Secondary | ICD-10-CM | POA: Diagnosis not present

## 2024-08-24 DIAGNOSIS — C189 Malignant neoplasm of colon, unspecified: Secondary | ICD-10-CM

## 2024-08-24 DIAGNOSIS — C181 Malignant neoplasm of appendix: Secondary | ICD-10-CM
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
## 2024-08-24 NOTE — Progress Notes (Signed)
 PATIENT NAVIGATOR PROGRESS NOTE  Name: Julie Greene Date: 08/24/2024 MRN: [id number]  DOB: 04-Feb-1956   Reason for visit:  New Pt appointment   Comments:  Met with [name] during new pt appt with Olam Ned NP and Dr. Cloretta.  Referral to SW Referral to Nutrition Pet scan to be scheduled- education provided Guardant 360 drawn today Port-a-cath education provided.  Signatera to be drawn at next visit.  Patient given Journey's notebook with disease specific information as well as contact information.     Time spent counseling/coordinating care: > 60 minutes

## 2024-08-25 ENCOUNTER — Telehealth: Payer: Self-pay

## 2024-08-25 NOTE — Telephone Encounter (Signed)
 [facility] Clinical Social Work  Clinical Social Work was referred by statistician for assessment of psychosocial needs.  Clinical Social Worker contacted patient by phone to offer support and assess for needs. Patient unable to speak at time of call. Patient preferred to call CSW when she is able to speak. Direct contact provided.    Lizbeth Sprague, LCSW  Clinical Social Worker [facility]

## 2024-08-30 ENCOUNTER — Inpatient Hospital Stay

## 2024-08-31 ENCOUNTER — Telehealth: Payer: Self-pay | Admitting: Nutrition

## 2024-09-01 ENCOUNTER — Inpatient Hospital Stay

## 2024-09-01 ENCOUNTER — Inpatient Hospital Stay: Admitting: Oncology

## 2024-09-01 ENCOUNTER — Other Ambulatory Visit (HOSPITAL_COMMUNITY): Payer: Self-pay

## 2024-09-01 ENCOUNTER — Other Ambulatory Visit: Payer: Self-pay | Admitting: *Deleted

## 2024-09-01 LAB — SURGICAL PATHOLOGY

## 2024-09-01 NOTE — Progress Notes (Signed)
 [facility] CSW Progress Note  Clinical Social Work Intern contacted patient by phone to follow-up on referral from CSW for GI cancer support group.    Interventions: Provided patient with information about the support group content and meeting details.  Provided patient with CSW Intern's direct contact information and encouraged her to call with any questions or concerns.      Follow Up Plan:  Patient will come to support center for next Support Group on 09/29/24. Patient will call CSW Intern if needs arise in the meantime.    Thersia KATHEE Daring Clinical Social Worker [facility]

## 2024-09-01 NOTE — Progress Notes (Signed)
 [facility] CSW Progress Note  Patient called CSW to further discuss support services available through cancer center.   Interventions: Provided patient with information about GI Support Group  Provided information about classes offered at the cancer center.  Placed transportation referral to [employer].   Follow Up Plan:  Patient will contact CSW with any support or resource needs    Lizbeth Sprague, LCSW Clinical Social Worker [facility]

## 2024-09-01 NOTE — Progress Notes (Signed)
 The proposed treatment discussed in conference is for discussion purpose only and is not a binding recommendation.  The patients have not been physically examined, or presented with their treatment options.  Therefore, final treatment plans cannot be decided.

## 2024-09-03 ENCOUNTER — Encounter (HOSPITAL_COMMUNITY): Admission: RE | Admit: 2024-09-03 | Source: Ambulatory Visit

## 2024-09-03 ENCOUNTER — Inpatient Hospital Stay

## 2024-09-03 DIAGNOSIS — C181 Malignant neoplasm of appendix: Secondary | ICD-10-CM

## 2024-09-03 LAB — GLUCOSE, CAPILLARY: Glucose-Capillary: 111 mg/dL — ABNORMAL HIGH (ref 70–99)

## 2024-09-03 MED ORDER — FLUDEOXYGLUCOSE F - 18 (FDG) INJECTION
8.2000 | Freq: Once | INTRAVENOUS | Status: AC
  Administered 2024-09-03: 8.26 via INTRAVENOUS

## 2024-09-03 NOTE — Progress Notes (Signed)
 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  Followed by Dr Cloretta.  Noted 2 hospital admission.  08/02/24 had open right colectomy and small bowel resection.  PET scan this am. Likely treatment will be systemic chemotherapy per MD note.  History of GERD, HTN, anemia, pre-DM, HLD.   Met with patient in Support Services clinic.  Reports that appetite is better.  Breakfast is usually oatmeal or toast with egg and applesauce, juice or hot tea or hot cider.  Lunch is usually sandwich or frozen meal.  Dinner is meat and couple sides.  Drinks Activa shakes and makes smoothie with fruit, protein powder, milk or almond milk.  Reports normal bowel movement 1-2 times a day.     Medications: vit D   Labs: reviewed   Anthropometrics:   Height: 64 inches Weight: 167 lb UBW: 169 lb BMI: 28  Wants to decrease weight to 150 lb   Estimated Energy Needs  Kcals: 8124-7749 Protein: 90-112 g Fluid: < 1875 ml   NUTRITION DIAGNOSIS: Food and nutrition related knowledge deficit related to cancer, recent surgery and upcoming treatment as evidenced by consult with RD   MALNUTRITION DIAGNOSIS: none at this time   INTERVENTION:  Discussed importance of nutrition for wound healing and during treatment.  Discussed Nutrition following Intestinal Surgery. Handout provided Continue oral nutrition supplement/homemade smoothie with protein powder Contact information provided   MONITORING, EVALUATION, GOAL: weight trends intake   Next Visit: to be determined   Roxy Filler B. Dasie SOLON, CSO, LDN Registered Dietitian [phone]

## 2024-09-06 ENCOUNTER — Inpatient Hospital Stay: Admitting: Nurse Practitioner

## 2024-09-08 ENCOUNTER — Inpatient Hospital Stay: Admitting: Oncology
